# Patient Record
Sex: Male | Born: 1935
Health system: Southern US, Community
[De-identification: ages and names within clinical notes are randomized; demographics above are authoritative.]

## PROBLEM LIST (undated history)

## (undated) DIAGNOSIS — R7303 Prediabetes: Secondary | ICD-10-CM

## (undated) DIAGNOSIS — E785 Hyperlipidemia, unspecified: Secondary | ICD-10-CM

## (undated) DIAGNOSIS — I2510 Atherosclerotic heart disease of native coronary artery without angina pectoris: Secondary | ICD-10-CM

## (undated) DIAGNOSIS — I10 Essential (primary) hypertension: Secondary | ICD-10-CM

## (undated) HISTORY — DX: Atherosclerotic heart disease of native coronary artery without angina pectoris: I25.10

## (undated) HISTORY — PX: CORONARY ANGIOPLASTY WITH STENT PLACEMENT: SHX49

## (undated) HISTORY — DX: Prediabetes: R73.03

## (undated) HISTORY — DX: Essential (primary) hypertension: I10

## (undated) HISTORY — DX: Hyperlipidemia, unspecified: E78.5

## (undated) HISTORY — PX: TONSILLECTOMY: SUR1361

## (undated) HISTORY — PX: HERNIA REPAIR: SHX51

---

## 2002-05-15 ENCOUNTER — Inpatient Hospital Stay (HOSPITAL_COMMUNITY): Admission: EM | Admit: 2002-05-15 | Discharge: 2002-05-17 | Payer: Self-pay | Admitting: *Deleted

## 2003-03-15 ENCOUNTER — Encounter: Payer: Self-pay | Admitting: Cardiology

## 2003-03-15 ENCOUNTER — Inpatient Hospital Stay (HOSPITAL_COMMUNITY): Admission: EM | Admit: 2003-03-15 | Discharge: 2003-03-16 | Payer: Self-pay | Admitting: Emergency Medicine

## 2003-03-15 ENCOUNTER — Encounter: Payer: Self-pay | Admitting: Emergency Medicine

## 2004-03-17 ENCOUNTER — Encounter: Admission: RE | Admit: 2004-03-17 | Discharge: 2004-03-17 | Payer: Self-pay | Admitting: Family Medicine

## 2004-04-25 ENCOUNTER — Ambulatory Visit (HOSPITAL_COMMUNITY): Admission: RE | Admit: 2004-04-25 | Discharge: 2004-04-25 | Payer: Self-pay | Admitting: Gastroenterology

## 2004-11-20 ENCOUNTER — Ambulatory Visit: Payer: Self-pay | Admitting: Cardiology

## 2005-12-19 ENCOUNTER — Ambulatory Visit: Payer: Self-pay | Admitting: Cardiology

## 2006-12-13 ENCOUNTER — Ambulatory Visit: Payer: Self-pay | Admitting: Cardiology

## 2007-06-17 ENCOUNTER — Encounter: Payer: Self-pay | Admitting: Cardiology

## 2007-12-23 ENCOUNTER — Ambulatory Visit: Payer: Self-pay | Admitting: Cardiology

## 2009-01-14 ENCOUNTER — Ambulatory Visit: Payer: Self-pay | Admitting: Cardiology

## 2009-01-14 ENCOUNTER — Encounter: Payer: Self-pay | Admitting: Physician Assistant

## 2009-01-19 ENCOUNTER — Encounter: Payer: Self-pay | Admitting: Cardiology

## 2009-10-18 ENCOUNTER — Telehealth (INDEPENDENT_AMBULATORY_CARE_PROVIDER_SITE_OTHER): Payer: Self-pay | Admitting: *Deleted

## 2010-01-13 ENCOUNTER — Encounter: Payer: Self-pay | Admitting: Cardiology

## 2010-01-17 ENCOUNTER — Encounter: Payer: Self-pay | Admitting: Cardiology

## 2010-01-18 ENCOUNTER — Ambulatory Visit: Payer: Self-pay | Admitting: Cardiology

## 2010-01-26 ENCOUNTER — Encounter: Payer: Self-pay | Admitting: Cardiology

## 2010-02-01 ENCOUNTER — Encounter (INDEPENDENT_AMBULATORY_CARE_PROVIDER_SITE_OTHER): Payer: Self-pay | Admitting: *Deleted

## 2010-08-15 NOTE — Progress Notes (Signed)
Summary: NEED NEW RXS/NAME OF PCP CARDIOLOGIST PREFERS  Phone Note Call from Patient Call back at Home Phone [phone]   Caller: Spouse Call For: nurse Summary of Call: patient changing pharmacies and need new rxs sent  [facility]. Nurse informed her that rxs would be sent over now. Wife also would like to know names of PCP that Dr. Myrtis Ser would refer patient to since their PCP is no longer practicing in the area.     Initial call taken by: Carlye Grippe,  October 18, 2009 2:52 PM  Follow-up for Phone Call        If the patient lives in [place], please call our [place] office  to find out who they are recommending there. Let me know.   Additional Follow-up for Phone Call Additional follow up Details #1::        I recommend [employer] to call first. Also Brett Canales or [employer]. Also Syliva Overman.    Additional Follow-up for Phone Call Additional follow up Details #2::    Patient's wife informed of the above via message machine.  Follow-up by: Carlye Grippe,  October 28, 2009 11:06 AM  New/Updated Medications: ALTACE 2.5 MG TABS (RAMIPRIL) Take 1 tablet by mouth once a day Prescriptions: ALTACE 2.5 MG TABS (RAMIPRIL) Take 1 tablet by mouth once a day  #90 x 0   Entered by:   Carlye Grippe   Authorized by:   Talitha Givens, MD, [facility]   Signed by:   Carlye Grippe on 10/18/2009   Method used:   Electronically to        [facility] [phone]* (retail)       [address]       [place], [place]  [address]       Ph: [phone] or [phone]       Fax: [phone]   RxID:   [id number] ZETIA 10 MG TABS (EZETIMIBE) Take 1 tablet by mouth at bedtime  #90 x 0   Entered by:   Carlye Grippe   Authorized by:   Talitha Givens, MD, [facility]   Signed by:   Carlye Grippe on 10/18/2009   Method used:   Electronically to        [facility] [phone]* (retail)       [address]       [place], [place]  [address]    Ph: [phone] or [phone]       Fax: [phone]   RxID:   [id number] METOPROLOL SUCCINATE 100 MG XR24H-TAB (METOPROLOL SUCCINATE) Take 1 tablet by mouth once a day  #90 x 0   Entered by:   Carlye Grippe   Authorized by:   Talitha Givens, MD, [facility]   Signed by:   Carlye Grippe on 10/18/2009   Method used:   [facility]        [facility]  [address] [phone]* (retail)       [address]       [place], [place]  [address]       Ph: [phone] or [phone]       Fax: [phone]   RxID:   [id number] LIPITOR 80 MG TABS (ATORVASTATIN CALCIUM) Take 1 tablet by mouth once a day  #90 x 0   Entered by:   Carlye Grippe   Authorized  by:   Talitha Givens, MD, [facility]   Signed by:   Carlye Grippe on 10/18/2009   Method used:   Electronically to        [facility]. [phone]* (retail)       [address]       [place], [place]  [address]       Ph: [phone] or [phone]       Fax: [phone]   RxID:   [id number]

## 2010-08-15 NOTE — Miscellaneous (Signed)
  Clinical Lists Changes  Observations: Added new observation of PAST MED HX: HYPERTENSION HYPERLIPIDEMIA CAD   2003..DES  proximal LAD   /   cath 2004....cutting balloon...distal LAD Borderline diabetes mellitus. LV  normal DM   borderline Tobacco abuse (01/17/2010 18:36) Added new observation of PRIMARY MD: Illa Level, MD (01/17/2010 18:36)       Past History:  Past Medical History: HYPERTENSION HYPERLIPIDEMIA CAD   2003..DES  proximal LAD   /   cath 2004....cutting balloon...distal LAD Borderline diabetes mellitus. LV  normal DM   borderline Tobacco abuse

## 2010-08-15 NOTE — Assessment & Plan Note (Signed)
Summary: 6 MO FU PER JULY REMINDER-SRS   Visit Type:  Follow-up Primary Provider:  [employer]  CC:  CAD.  History of Present Illness: The patient is seen for cardiology followup.  He has known coronary disease.  He works every day with [employer] for [employer].  He has absolutely no symptoms.  He had a stent to the proximal LAD in 2003 and cutting balloon to the distal LAD in 2004.  He's not had any studies since then.  He prefers not to be evaluated unless he has symptoms.  There is normal LV function.  He continues to chew tobacco.  Preventive Screening-Counseling & Management  Alcohol-Tobacco     Smoking Status: quit     Year Quit: 1960     Cans of tobacco/week: chews 6-7pks/wk     Tobacco Counseling: to quit use of tobacco products  Current Medications (verified): 1)  Altace 2.5 Mg Tabs (Ramipril) .... Take 1 Tablet By Mouth Once A Day 2)  Lipitor 80 Mg Tabs (Atorvastatin Calcium) .... Take 1/2 Tablet By Mouth Once A Day 3)  Metoprolol Succinate 100 Mg Xr24h-Tab (Metoprolol Succinate) .... Take 1 Tablet By Mouth Once A Day 4)  Zetia 10 Mg Tabs (Ezetimibe) .... Take 1 Tablet By Mouth At Bedtime 5)  Nitroglycerin 0.4 Mg Subl (Nitroglycerin) .... One Tablet Under Tongue Every 5 Minutes As Needed For Chest Pain---May Repeat Times Three 6)  Aspir-Low 81 Mg Tbec (Aspirin) .... Take 1 Tablet By Mouth Once A Day  Allergies (verified): No Known Drug Allergies  Comments:  Nurse/Medical Assistant: The patient's medication list and allergies were reviewed with the patient and were updated in the Medication and Allergy Lists.  Past History:  Past Medical History: HYPERTENSION HYPERLIPIDEMIA CAD   2003..DES  proximal LAD   /   cath 2004....cutting balloon...distal LAD Borderline diabetes mellitus. LV  normal DM   borderline Tobacco abuse (chewing tobacco) Abdominal ultrasound.... July, 2010.... no evidence of abdominal aortic aneurysm  Social History: Cans of  tobacco/week:  chews 6-7pks/wk  Review of Systems       Patient denies fever, chills, headache, sweats, rash, change in vision, change in hearing, chest pain, cough, nausea vomiting, urinary symptoms.  All other systems are reviewed and are negative.  Vital Signs:  Patient profile:   77 year old male Height:      70 inches Weight:      165 pounds BMI:     23.76 Pulse rate:   73 / minute BP sitting:   122 / 78  (left arm) Cuff size:   regular  Vitals Entered By: Carlye Grippe (January 18, 2010 1:03 PM) CC: CAD   Physical Exam  General:  patient is stable. Head:  head is atraumatic. Eyes:  no xanthelasma. Neck:  no jugular venous tension. There is question of a soft right carotid bruit. Chest Wall:  no chest wall tenderness. Lungs:  lungs are clear.  Respiratory effort is nonlabored. Heart:  cardiac exam reveals S1-S2.  No clicks or significant murmurs. Abdomen:  abdomen is soft. Msk:  no musculoskeletal deformities. Extremities:  no peripheral edema. Skin:  no skin rashes. Psych:  patient is oriented to person time and place.  Affect is normal.   Impression & Recommendations:  Problem # 1:  * ??  CAROTID BRUIT There is question of a very soft carotid bruit.  We will obtain carotid Doppler.  I have no record of any carotid Doppler in the past.  Problem # 2:  HYPERTENSION, UNSPECIFIED (ICD-401.9)  His updated medication list for this problem includes:    Altace 2.5 Mg Tabs (Ramipril) .Marland Kitchen... Take 1 tablet by mouth once a day    Metoprolol Succinate 100 Mg Xr24h-tab (Metoprolol succinate) .Marland Kitchen... Take 1 tablet by mouth once a day    Aspir-low 81 Mg Tbec (Aspirin) .Marland Kitchen... Take 1 tablet by mouth once a day Blood pressure is under good control.  No change in therapy.  Problem # 3:  HYPERLIPIDEMIA-MIXED (ICD-272.4)  His updated medication list for this problem includes:    Lipitor 80 Mg Tabs (Atorvastatin calcium) .Marland Kitchen... Take 1/2 tablet by mouth once a day    Zetia 10 Mg Tabs  (Ezetimibe) .Marland Kitchen... Take 1 tablet by mouth at bedtime Patient is on meds for his lipids.  Labs are followed by his primary M.D.  Problem # 4:  CAD, NATIVE VESSEL (ICD-414.01)  His updated medication list for this problem includes:    Altace 2.5 Mg Tabs (Ramipril) .Marland Kitchen... Take 1 tablet by mouth once a day    Metoprolol Succinate 100 Mg Xr24h-tab (Metoprolol succinate) .Marland Kitchen... Take 1 tablet by mouth once a day    Nitroglycerin 0.4 Mg Subl (Nitroglycerin) ..... One tablet under tongue every 5 minutes as needed for chest pain---may repeat times three    Aspir-low 81 Mg Tbec (Aspirin) .Marland Kitchen... Take 1 tablet by mouth once a day  Orders: EKG w/ Interpretation (93000) Coronary disease is stable.  He is very active and has no symptoms.  EKG is done today and reviewed by me.  It is normal.  Patient is on appropriate medications.  No further workup and no change in therapy at this time.  We'll see him back in one year.  Other Orders: Carotid Duplex (Carotid Duplex)  Patient Instructions: 1)  Your physician wants you to follow-up in: 1 year. You will receive a reminder letter in the mail one-two months in advance. If you don't receive a letter, please call our office to schedule the follow-up appointment. 2)  Your physician recommends that you continue on your current medications as directed. Please refer to the Current Medication list given to you today. 3)  Your physician has requested that you have a carotid duplex. This test is an ultrasound of the carotid arteries in your neck. It looks at blood flow through these arteries that supply the brain with blood. Allow one hour for this exam. There are no restrictions or special instructions. If the results of your test are normal or stable, you will receive a letter. If they are abnormal, the nurse will contact you by phone.

## 2010-08-15 NOTE — Letter (Signed)
Summary: Engineer, materials at [facility]  [address]   [place], [place] [address]   Phone: [phone]  Fax: [phone]        February 01, 2010 MRN: [id number]    TAYSEAN WAGER [address] [place], [place]  [address]    Dear Mr. Ehle,  Your test ordered by Selena Batten has been reviewed by your physician (or physician assistant) and was found to be normal or stable. Your physician (or physician assistant) felt no changes were needed at this time.  ____ Echocardiogram  ____ Cardiac Stress Test  ____ Lab Work  __X__ Peripheral vascular study of neck-  ____ CT scan or X-ray  ____ Lung or Breathing test  ____ Other:   Thank you.   Cyril Loosen, RN, BSN    Duane Boston, M.D., F.A.C.C. Thressa Sheller, M.D., F.A.C.C. Oneal Grout, M.D., F.A.C.C. Cheree Ditto, M.D., F.A.C.C. Daiva Nakayama, M.D., F.A.C.C. Kenney Houseman, M.D., F.A.C.C. Jeanne Ivan, PA-C

## 2010-11-28 NOTE — Assessment & Plan Note (Signed)
[facility]                          [facility]   Randy Moyer, BISCHOF                         MRN:          [id number]  DATE:12/13/2006                            DOB:          1930-09-23    CARDIOLOGIST:  Dr. Willa Rough   PRIMARY CARE PHYSICIAN:  Dr. Henrine Screws of [facility] at  [facility].   HISTORY OF PRESENT ILLNESS:  Randy Moyer is a 77 year old male patient  followed by Dr. Myrtis Ser with a history of coronary artery disease, status  post stenting to the distal LAD in October 2003, who subsequently  underwent balloon angioplasty with a cutting balloon of distal LAD  secondary to in-stent restenosis in August 2004.  He returns today for  routine followup.  He notes he is doing well with any chest pain or  shortness of breath.  Denies any syncope, near-syncope, orthopnea,  paroxysmal nocturnal dyspnea, or edema.  He recently notes some left  shoulder pain for which he decreased his Lipitor to 40 mg daily.  His  shoulder discomfort stopped after this.  He has his cholesterol checked  with Dr. Abigail Miyamoto.  He has borderline diabetes and has been followed  through it closely recently by Dr. Abigail Miyamoto.   CURRENT MEDICATIONS:  1. Toprol XL 100 mg daily.  2. Aspirin 325 mg daily.  3. Zetia 10 mg q.h.s.  4. Lipitor 40 mg q.h.s.  5. Altace 2.5 mg daily.  6. Nitroglycerin p.r.n. chest pain.   ALLERGIES:  No known drug allergies.   PHYSICAL EXAMINATION:  GENERAL:  He is a well-nourished, well-developed  male in no acute distress.  VITAL SIGNS:  Blood pressure is 135/76.  Pulse 54.  Weight 182.6 pounds.  HEENT:  Normal.  NECK:  Without JVD, carotids without bruits bilaterally.  CARDIAC:  Normal S1, S2.  Regular rate and rhythm without murmurs.  LUNGS:  Clear to auscultation bilaterally without wheezing, rhonchi, or  rales.  ABDOMEN:  Soft, nontender with normoactive bowel sounds with no  organomegaly.  EXTREMITIES:   Without edema.  Calves soft, nontender.  SKIN:  Warm and dry.  NEUROLOGIC:  He is alert and oriented x3.  Cranial nerves 2-12 are  grossly intact.   Electrocardiogram reveals sinus rhythm with a heart rate of 55, normal  axis, no acute changes.   IMPRESSION:  1. Coronary artery disease.      a.     Status post stenting to the distal left anterior descending       October 2003.      b.     Status post cutting balloon angioplasty to the distal left       anterior descending secondary to in-stent restenosis August 2004.      c.     Residual coronary disease at catheterization August 2004:       Left anterior descending 20% proximal.  Second diagonal ostial       60%.  Right coronary artery 30% mid.  Primary preserved left       ventricular function.  2. Treated dyslipidemia.  3.  Borderline diabetes followed by Dr. Abigail Miyamoto.  4. Treated hypertension.   PLAN:  The patient was also interviewed and examined by Dr. Myrtis Ser today.  From a cardiovascular standpoint, he is doing well.  He recently  decreased his Lipitor secondary to shoulder pain.  This has resolved  with reduced Lipitor.  He follows this closely with Dr. Abigail Miyamoto.  Of  course, his goal LDL is less than 70 given his history of coronary  artery disease.  This will be rechecked again in September by Dr.  Abigail Miyamoto.  He can follow up with Dr. Myrtis Ser in 12 months' time or sooner  p.r.n.      Tereso Newcomer, PA-C  Electronically Signed      Luis Abed, MD, [facility]  Electronically Signed   SW/MedQ  DD: 12/13/2006  DT: 12/13/2006  Job #: [id number]   cc:   Chales Salmon. Abigail Miyamoto, M.D.

## 2010-11-28 NOTE — Assessment & Plan Note (Signed)
[facility]                          [facility]   CONSTANT, MANDEVILLE                         MRN:          [id number]  DATE:01/14/2009                            DOB:          1931-06-18    PRIMARY CARDIOLOGIST:  Luis Abed, MD, [facility]   REASON FOR VISIT:  One-year followup.   Mr. Randy Moyer continues to do extremely well from a cardiovascular  standpoint, since his visit here last June.  He remains quite active,  working 40 days a week for [employer] for [employer].  He denies any  development of exertional angina pectoris or significant dyspnea.   Unfortunately, Mr. Pember continues to chew tobacco.  He quit smoking  about 50 years ago.   EKG today indicates NSR at 61 bpm with normal axis; no ischemic changes.   REVIEW OF SYSTEMS:  Denies intermittent claudication, but complains of  leg cramps when lying in bed at night.  Remaining systems reviewed and  are negative.   MEDICATIONS:  1. Aspirin 325 daily.  2. Zetia 10 daily.  3. Lipitor 80 daily.  4. Altace 2.5 daily.  5. Toprol 100 daily.   PHYSICAL EXAMINATION:  VITAL SIGNS:  Blood pressure is 135/77, pulse is  64, regular, and weight is 169.  GENERAL:  A 77 year old male sitting upright in no distress.  HEENT:  Normocephalic, atraumatic.  NECK:  Palpable carotid pulse without bruits.  LUNGS:  Clear to auscultation in all fields.  HEART:  Regular rate and rhythm.  No significant murmurs.  No rubs.  ABDOMEN:  Soft, nontender.  Palpable epigastric mass, with soft bruit.  EXTREMITIES:  Palpable bilateral femoral pulse without bruits; palpable  posterior tibialis and dorsalis pedis pulses.  NEUROLOGIC:  No focal deficit.   IMPRESSION:  1. Severe single-vessel coronary artery disease.      a.     Status post cutting balloon PTCA of distal LAD, August       2004, secondary to the high-grade in-stent restenosis.      b.     Non STEMI/drug-eluting stent of high-grade proximal  LAD,       November 2003.  2. Normal LVF.  3. Dyslipidemia, well-controlled.      a.     LDL 59, December 2009.  4. Hypertension.  5. Borderline diabetes mellitus.  6. Ongoing tobacco.   PLAN:  1. Continue current medication regimen.  2. As previously noted, we will defer scheduling a screening stress      test given that he continues to remain quite active and      asymptomatic.  3. Surveillance abdominal ultrasound to rule out AAA, given his      history of tobacco smoking.  4. Decrease aspirin to 81 mg daily.  Renew prescription for p.r.n.      nitroglycerin.  5. Schedule return clinic followup with Dr. Myrtis Ser in 1 year.      Rozell Searing, PA-C  Electronically Signed      Luis Abed, MD, [facility]  Electronically Signed   GS/MedQ  DD: 01/14/2009  DT: 01/15/2009  Job #: [id number]   cc:   Chales Salmon. Abigail Miyamoto, M.D.

## 2010-11-28 NOTE — Assessment & Plan Note (Signed)
[facility]                          [facility]   LEALAND, ELTING                         MRN:          [id number]  DATE:12/23/2007                            DOB:          06-Feb-1931    Randy Moyer is doing very well.  He has coronary disease.  He is post  stenting to the distal LAD in 2003, and balloon angioplasty with cutting  balloon to the distal LAD secondary to in-stent stenosis in 2004.  Since  then he has had no symptoms.  He is quite active.  He does missionary  work in various places and has been fully active and has no problems.  He has no chest pain.  He has no shortness of breath.  There has been no  syncope or presyncope.   PAST MEDICAL HISTORY:   ALLERGIES:  No known drug allergies.   MEDICATIONS:  1. Toprol 100.  2. Aspirin 325.  3. Zetia 10.  4. Lipitor 80.  5. Altace 2.5.   His labs will be followed through Dr. Abigail Miyamoto, when the patient sees Dr.  Abigail Miyamoto over the next several months.   OTHER MEDICAL PROBLEMS:  See the list below.   REVIEW OF SYSTEMS:  He has no GI or GU symptoms.  He is not having any  headaches or fever.  He is fully active and has absolutely no  complaints.  He needs his medicines refilled, which we of course will  do.  His review of systems otherwise, is negative.   PHYSICAL EXAMINATION:  Today, his weight is 183 pounds.  This compares  to the same weight of last year.  Blood pressure is 140/72 with a pulse  of 61.  The patient is oriented to person, time, and place.  Affect is  normal.  HEENT:  No xanthelasma.  He has normal extraocular motion.  There are no carotid bruits.  There is no jugular venous distention.  LUNGS:  Clear.  Respiratory effort is not labored.  CARDIAC:  S1 with an S2.  There are no clicks or significant murmurs.  ABDOMEN:  Soft.  He has no masses or bruits.  There is no peripheral edema.  There are no musculoskeletal deformities.   EKG reveals normal sinus  rhythm.  There are no significant  abnormalities.   PROBLEMS:  1. Coronary artery disease with stenting to the distal LAD in October      2003, and cutting balloon to the same area in August 2004.  He is      quite stable.  In 2004, he had mild disease of a second diagonal      and minimal disease of his right coronary.  He had good LV      function.  The patient has not had exercise testing.  I have      discussed this with him.  He is fully active.  Even though it has      been many years, he has no symptoms and I am comfortable following      him, and he agrees.  2.  Hyperlipidemia.  This is being treated aggressively through Dr.      Abigail Miyamoto.  3. Borderline diabetes ?,  followed by Dr. Abigail Miyamoto.  4. Hypertension, treated.  5. Ongoing use of tobacco products.  The patient continues to chew      tobacco.  I discussed this with him again.  I reminded him that we      want him to stop completely.  If he cannot we urge him to cut his      usage down to the lowest level possible.  I had a full discussion      with him about this.   I am pleased with his overall status.  I will see him back in 1 year.     Randy Abed, MD, [facility]  Electronically Signed    JDK/MedQ  DD: 12/23/2007  DT: 12/24/2007  Job #: [id number]   cc:   Chales Salmon. Abigail Miyamoto, M.D.

## 2010-12-01 NOTE — H&P (Signed)
NAME:  Randy Moyer, Randy Moyer                            ACCOUNT NO.:  [id number]   MEDICAL RECORD NO.:  [id number]                   PATIENT TYPE:  INP   LOCATION:  3732                                 FACILITY:  [facility]   PHYSICIAN:  Creta Levin, M.D. [facility]      DATE OF BIRTH:  Feb 06, 1931   DATE OF ADMISSION:  03/15/2003  DATE OF DISCHARGE:                                HISTORY & PHYSICAL   CARDIOLOGIST:  Willa Rough, M.D.   PRIMARY CARE Kalena Mander:  Chales Salmon. Abigail Miyamoto, M.D.   CHIEF COMPLAINT:  Chest pain.   HISTORY OF PRESENT ILLNESS:  Randy Moyer is a 77 year old gentleman with  coronary artery disease who complains of 5/10 substernal chest pressure that  has been on and off for the past one to two days.  He states that the pain  is similar to the pain of his last heart attack, lasts for minutes at a  time, is worsened with exertion and with lying flat, and is partially  relieved with sublingual nitroglycerin.  He denies radiation of this pain,  shortness of breath, nausea, vomiting, diaphoresis, palpitations,  presyncope, or syncope.   PAST MEDICAL HISTORY:  1. Coronary artery disease.     a. Non ST elevation MI in October 2003.     b. Cardiac catheterization revealed the following:  There is no left        main.  LAD was 99% proximal.  Left circumflex was normal.  RCA was        normal.     c. PCI of the LAD with a 2.5 x 13 mm Cypher stent.  2. Hyperlipidemia.  3. Umbilical hernia.   ALLERGIES:  He has no known drug allergies.   MEDICATIONS:  1. Aspirin 81 mg daily.  2. Toprol XL 50 mg daily.  3. Zocor 80 mg q.h.s.  4. Zetia 10 mg daily.  5. Nitroglycerin p.r.n.   SOCIAL HISTORY:  He lives between [place] and [place] in [place] with  his wife.  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  He chews  tobacco and drinks approximately six or seven beers per day most days of the  week.   FAMILY HISTORY:  Coronary disease in his mother who died in her 22s.   His  father died of unknown causes.   REVIEW OF SYSTEMS:  Positive for chest pain, but otherwise negative for  complete 10 point review of systems.  He is a full code.   PHYSICAL EXAMINATION:  VITAL SIGNS:  Temperature 98.6, blood pressure  144/78, heart rate 78, respiratory rate normal, SAO2 98% on room air.  GENERAL:  He is in no acute distress.  HEENT:  He does have some abnormalities with his right eye which are  chronic.  He has moist mucous membranes.  NECK:  No jugular venous distention.  No carotid bruits.  No thyromegaly.  LUNGS:  Clear.  CARDIOVASCULAR:  Regular  rate and rhythm with no gallop or murmur.  There  was no rub.  SKIN:  He did have some actinic keratoses on the left chest wall.  ABDOMEN:  Soft, nondistended, nontender with normoactive bowel sounds.  RECTAL:  Heme-negative.  EXTREMITIES:  No clubbing, cyanosis, edema.  His femoral pulses were strong  and without bruits.  NEUROLOGIC:  Nonfocal.   LABORATORIES:  Chest x-ray:  There was some left subsegmental atelectasis  and ectatic aortic arch.  No edema.  No cardiomegaly.  His ECG revealed  normal sinus rhythm at a rate of 79 with no evidence of ischemia or infarct.  His laboratories thus far, hematocrit 43.  Sodium 140, potassium 3.9,  chloride 108, BUN 24, creatinine 1.1, glucose 83.  CK-MB 1.2, troponin I  less than 0.05.  The rest of his laboratories are pending.   ASSESSMENT/PLAN:  1. Chest pain.  These are similar symptoms to his last non ST elevation     myocardial infarction.  He was started on heparin in the emergency     department and we will continue this in addition to aspirin, metoprolol,     Zocor, and Zetia.  We will attempt to wean his nitroglycerin to     nitroglycerin paste.  We will add Altace to his regimen.  To further risk     stratify him we will need to perform a cardiac catheterization to assess     his LAD stent patency.  At this point I will not give him Plavix given     that there  is a possibility he may need surgical revascularization.  At     this point he is very stable.  If he has recurrent symptoms, EKG changes,     or positive bio markers, then I will add a 2B3A inhibitor.  Again, given     that his revascularization options are not identified, I will hold off on     giving him ReoPro.  If he does need the 2B3A inhibitor I will give him     Integrilin.  Apparently, he has pretty impressive hyperlipidemia and has     required Zetia on top of full dose Zocor.  I will check his a.m. lipid     panel and make adjustments accordingly.  2. ETOH use.  The patient does have a pretty significant drinking history.     He did not have problems with delirium tremens in the past and has never     had any events of such.  I will, however, have a low threshold for     starting him on Ativan for delirium tremens prophylaxis.                                                Creta Levin, M.D. [facility]    RPK/MEDQ  D:  03/15/2003  T:  03/15/2003  Job:  [id number]   cc:   Chales Salmon. Abigail Miyamoto, M.D.  [address]  [place]  [place] [address]  Fax: [phone]

## 2010-12-01 NOTE — Op Note (Signed)
NAME:  Randy Moyer, Randy Moyer NO.:  [id number]   MEDICAL RECORD NO.:  [id number]          PATIENT TYPE:  AMB   LOCATION:  ENDO                         FACILITY:  [facility]   PHYSICIAN:  Graylin Shiver, M.D.   DATE OF BIRTH:  12-25-1930   DATE OF PROCEDURE:  04/25/2004  DATE OF DISCHARGE:                                 OPERATIVE REPORT   PROCEDURE:  Colonoscopy.   INDICATIONS:  Screening.   Informed consent was obtained after explanation of the risks of bleeding,  infection, and perforation.   PREMEDICATION:  Fentanyl 50 mcg IV, Versed 7.5 mg IV.   PROCEDURE:  With the patient in the left lateral decubitus position, a  rectal exam was performed.  No masses were fel.  The Olympus colonoscope was  inserted into the rectum and advanced around the colon to the cecum.  Cecal  landmarks were identified.  The scope was brought back, visualizing the  colonic mucosa.  There were a few scattered diverticula noted around the  colon, including some seen on the right side of the colon.  There were no  other abnormalities noted from cecum to rectum.  He tolerated the procedure  well without complications.   IMPRESSION:  Scattered diverticula in the colon.   I would recommend a follow-up screening colonoscopy again in 10 years.       SFG/MEDQ  D:  04/25/2004  T:  04/25/2004  Job:  [id number]   cc:   Chales Salmon. Abigail Miyamoto, M.D.  [address]  [place]  [place] [address]  Fax: [phone]

## 2010-12-01 NOTE — Cardiovascular Report (Signed)
NAME:  Randy Moyer, Randy Moyer                            ACCOUNT NO.:  [id number]   MEDICAL RECORD NO.:  [id number]                   PATIENT TYPE:  INP   LOCATION:  6529                                 FACILITY:  [facility]   PHYSICIAN:  Veneda Melter, M.D.                   DATE OF BIRTH:  08-20-1930   DATE OF PROCEDURE:  03/15/2003  DATE OF DISCHARGE:                              CARDIAC CATHETERIZATION   PROCEDURES PERFORMED:  1. Left heart catheterization.  2. Left ventriculogram.  3. Selective coronary angiography.  4. PTCA of the distal left anterior descending.   DIAGNOSES:  1. Single vessel coronary artery disease.  2. In-stent restenosis.  3. Normal left ventricular systolic function.   HISTORY:  Randy Moyer is a 77 year old white male with a history of coronary  disease who has previously undergone percutaneous intervention with stent  placement to the distal LAD on May 15, 2002.  He presents now with  crescendo and unstable angina prompting admission to the hospital.  He is  ruled out for acute myocardial infarction.  Presents for further assessment.   TECHNIQUE:  Informed consent was obtained.  The patient was brought to the  catheterization laboratory.  A 6-French sheath placed in the right femoral  artery using modified Seldinger technique.  A 6-French JL4 and JR4 catheter  was then used to engage the left and right coronary arteries.  Selective  angiography performed in various projections using manual injections of  contrast.  A 6-French pigtail catheter was advanced left ventricle and left  ventriculogram performed using power injections of contrast.  A 6-French  Q3.5 guide catheter was inserted and used to perform further angiography  LAD.   FINDINGS:  1. Left main trunk:  Short.  Angiographically normal.  2. LAD:  This begins as a medium caliber vessel and provides two diagonal     branches and then extends to the apex.  The LAD has mild disease of 20%     in the  proximal mid section.  There is a previously placed stent in the     distal section immediately after the second diagonal branch with severe     narrowing of 99% in the proximal segment of this stent.  There is TIMI 2     flow in the distal LAD and this vessel has diffuse disease of 30%.  The     first diagonal branch is a small caliber vessel with mild irregularities.     Second diagonal branch is a large vessel that bifurcates in the mid     section.  There is ostial disease of 60%.  3. Left circumflex artery:  This is a medium caliber vessel and consists of     a large marginal branch mid section.  Left circumflex has mild     irregularities.  4. Right coronary artery is dominant.  This is  a large caliber vessel that     provides a posterior descending artery and a two posterior ventricular     branches in the terminal segment.  The right coronary has mild disease of     30% in the mid section.  5. LV:  Normal end-systolic and end-diastolic dimensions.  Overall left     ventricular function is well preserved.  Ejection fraction greater than     55%.  No mitral regurgitation.  LV pressure is 100/10.  Aortic is 100/55.     LVEDP equals 15.   With these findings, we elected to proceed with percutaneous intervention to  the distal LAD.  The patient was enrolled in the ACUITY study and randomized  to Angiomax and Integrilin in the laboratory.  He had previously been on  aspirin.  A 0.014 inch Asahi soft wire was advanced into the diagonal branch  for protection and a Cross-It 200 XT introduced and used to engage the  distal LAD.  After several attempts this was advanced into the distal LAD.  A 2.5 x 10 mm Maverick balloon was introduced and two inflations performed  in the distal LAD at 6 atmospheres for 30 seconds with repeat angiography  showing significant improvement in vessel lumen and distal flow.  A 2.75 x  10 mm cutting balloon was then introduced and total of four inflations   performed in the distal LAD straddling the diagonal branch at 6 atmospheres  for 30 seconds.  Repeat angiography was then performed after the  administration of intracoronary nitroglycerin showing excellent result with  only mild residual narrowing of 30%.  There did appear to be some worsening  of the narrowing at the ostium of the second diagonal branch to perhaps 70%.  However, there was TIMI 3 flow in the LAD and diagonal branch and no vessel  damage.  This was deemed acceptable result.  The guide catheter was removed  and the sheath secured into position.  The patient tolerated procedure well  and was transferred to floor in stable condition.   FINAL RESULTS:  Successful PTCA of the distal left anterior descending with  reduction of 99% in-stent restenosis to less than 30% using a 2.75 mm  cutting balloon.   ASSESSMENT/PLAN:  Randy Moyer is a 77 year old gentleman with advanced single  vessel coronary artery disease.  He has unfortunately suffered restenosis of  a complex bifurcation lesion.  Should he have recurrent symptoms and  restenosis, additional stent placement in the LAD and diagonal branch may be  necessary.                                                Veneda Melter, M.D.    NG/MEDQ  D:  03/15/2003  T:  03/16/2003  Job:  [id number]   cc:   Chales Salmon. Abigail Miyamoto, M.D.  [address]  [place]  [place] [address]  Fax: [phone]   Willa Rough, M.D.

## 2010-12-01 NOTE — Cardiovascular Report (Signed)
NAME:  Randy Moyer, Randy Moyer                            ACCOUNT NO.:  [id number]   MEDICAL RECORD NO.:  [id number]                   PATIENT TYPE:  INP   LOCATION:  2925                                 FACILITY:  [facility]   PHYSICIAN:  Charlies Constable, M.D. [facility]              DATE OF BIRTH:  12/21/1930   DATE OF PROCEDURE:  05/15/2002  DATE OF DISCHARGE:                              CARDIAC CATHETERIZATION   CLINICAL HISTORY:  The patient is 77 years old and has no prior history of  known heart disease. He presented to [facility] Emergency Room with severe  chest pain and was transferred here and had positive enzymes. He was  enrolled in the Synergy trial and was randomized to unfractionated heparin  and was also treated with Integrilin.   PROCEDURES PERFORMED:  1. Left heart catheterization.  2. Coronary angiography.  3. Left ventriculography and stent placement.   DESCRIPTION OF PROCEDURE:  The procedure was performed via the right femoral  artery using an arterial sheath and 6 French preformed coronary catheters.  A front wall arterial puncture was performed and Omnipaque contrast was  used.  There was separate ostia of the LAD and circumflex.  We had  difficulty selectively engaging the LAD and finally accomplished this with a  diagnostic JR 3.5 guiding catheter.  After completion of the diagnostic  study, we made a decision to proceed with intervention on the LAD lesion.   The patient was given additional heparin to prolong the ACT to greater than  200 seconds.  He was given 300 mg of Plavix at the end of the procedure.  We  used a Q 3.5, 6 [place] guiding catheter with side holes and with some  difficulty we were able to selectively engage the LAD.  We were able to  navigate a luge wire across the lesion in the proximal LAD without too much  difficulty.  We direct stented with a 2.5 x 13 mm Cypher stent deploying  this at initial inflation of 11 atmospheres for 30 seconds and a second  inflation of 15 atmospheres for 30 seconds with the balloon just inside the  distal edge.  Repeat diagnostic studies were then performed through the  guiding catheter.  The patient tolerated the procedure well and left the  laboratory in satisfactory condition.   RESULTS:  There was no left main coronary artery.   Left anterior descending:  The left anterior descending artery gave rise to  two diagonal branches and a septal perforator and then had a 99% stenosis  with TIMI-2 flow distally.   Circumflex artery:  The circumflex artery which arose from a separate ostium  gave rise to a small marginal branch, a large marginal branch and an AV  branch which terminated in a small posterolateral branch.  These vessels  were free of significant disease.   Right coronary artery:  The right coronary is a  dominant vessel that gave  rise to two right ventricular branchers, a posterior descending branch and  three posterolateral branches.  These vessels were free of significant  disease.   LEFT VENTRICULOGRAPHY:  The left ventriculogram performed in the RAO  projection showed akinesis of the tip of the apex.  The overall wall motion  was good with an estimated ejection fraction of 60%.   Following stenting of the lesion in the proximal LAD, the stenosis improved  from 90% to less than 10% and the flow improved from TIMI-2 to TIMI-3 flow.   CONCLUSION:  1. Non-ST segment elevation myocardial infarction with 99% stenosis in the     proximal left anterior descending with TIMI-2 flow, no major obstruction     in the circumflex and right coronary arteries, and a small area of apical     akinesis.  2. Successful deployment of a Cypher stent in the proximal left anterior     descending with improvement in percent diameter narrowing from 99% to     less than 10% and improvement in flow from TIMI-2 to TIMI-3 flow.   DISPOSITION:  The patient was returned to the postangioplasty unit for  further  observation.   The left ventricular pressure was 109/14 and the aortic pressure was 109/59  with a mean of 87.                                                   Charlies Constable, M.D. [facility]    BB/MEDQ  D:  05/15/2002  T:  05/16/2002  Job:  [id number]   cc:   Chales Salmon. Abigail Miyamoto, M.D.   Willa Rough, M.D. [facility]   [facility]   Cardiopulmonary Laboratory

## 2010-12-01 NOTE — Discharge Summary (Signed)
   NAME:  Randy Moyer, Randy Moyer                            ACCOUNT NO.:  [id number]   MEDICAL RECORD NO.:  [id number]                   PATIENT TYPE:  INP   LOCATION:  3715                                 FACILITY:  [facility]   PHYSICIAN:  Dian Queen, P.A. [facility]               DATE OF BIRTH:  04/30/1931   DATE OF ADMISSION:  05/15/2002  DATE OF DISCHARGE:  05/17/2002                           DISCHARGE SUMMARY - REFERRING   HISTORY OF PRESENT ILLNESS:  The patient is a 77 year old white married male  with no prior cardiac history, who had intermittent substernal chest  discomfort described as indigestion that waxed and waned for a period of  several days, and was worse at night, when he tried to lie down.  This  finally worsened to the point where he went to the [facility]  Emergency Room where he had some acute electrocardiogram changes.  He was  given morphine.  He ruled in for an infarction, and was then begun on  aspirin, Lopressor, nitroglycerin, heparin, Integrilin, and transferred to  [facility] for further evaluation.   MEDICATIONS:  None regularly prior to admission.   ACCESSORY CLINICAL FINDINGS:  TSH 3.485 (normal), hemoglobin A1c of 5.8.  Electrolytes and renal function totally normal.  CK-MB peaked at 637/62.8,  troponin peaked at 7.3.  Hemoglobin 12.8, hematocrit 37.9, white count 8500.  A chest x-ray was unremarkable.   HOSPITAL COURSE:  The patient was transferred from [facility] after  ruling in.  He was taken urgently to the cardiac catheterization laboratory  by Dr. Charlies Constable and found to have a 99% proximal LAD after the takeoff  of the first diagonal.  This was dilated and stented without complications,  a little akinetic area at the apex.  The remainder of his coronaries were  totally normal.  His ejection fraction was 60%.   DISCHARGE DIAGNOSES:  1. Acute coronary syndrome with non-Q myocardial infarction, treated with     urgent left  anterior descending coronary artery stent, without     complications.  2. Preserved left ventricular function.  3. Significant ETOH use.   DISPOSITION:  The patient is discharged home.   DISCHARGE MEDICATIONS:  1. Toprol XL 50 mg q.d.  2. Aspirin one q.d.  3.     Plavix 75 mg q.d.  4. Zocor 20 q.h.s.  5. Nitroglycerin p.r.n.                                               Dian Queen, P.A. [facility]    BY/MEDQ  D:  05/17/2002  T:  05/18/2002  Job:  [id number]   cc:   Chales Salmon. Abigail Miyamoto, M.D.   [facility]  [place], [place]

## 2010-12-01 NOTE — H&P (Signed)
NAME:  Randy Moyer, Randy Moyer NO.:  [id number]   MEDICAL RECORD NO.:  [id number]                   PATIENT TYPE:  EMS   LOCATION:  MAJO                                 FACILITY:  [facility]   PHYSICIAN:  Willa Rough, MD [facility]                DATE OF BIRTH:  1931-05-23   DATE OF ADMISSION:  05/15/2002  DATE OF DISCHARGE:                                HISTORY & PHYSICAL   HISTORY OF PRESENT ILLNESS:  The patient is a pleasant 77 year old gentleman  who is retired from [employer] and lives between [place] and [place].  He developed chest discomfort on the morning of May 14, 2002.  This  resolved and he was able to work throughout the day.  At 7:30 in the evening  on May 14, 2002, he developed more severe chest pain; this persisted.  There was no nausea or vomiting but there was a sense of indigestion.  The  patient went to [facility] ER.  There, the EKG revealed no acute change.  He  received morphine for his pain; he also received aspirin, Lopressor and  nitroglycerin paste.  His enzymes came back positive with his troponin at  2.3.  I was contacted and heparin and Integrilin were added and the patient  was transported to [facility].  He was pain-free before he left the emergency room  at [facility] and he has not had return of pain since he has been here.  The  patient does have a family history of heart disease and he does chew  tobacco.   MEDICATIONS:  None.   ALLERGIES:  None.   PAST MEDICAL HISTORY:  Other medical problems:  See the complete medical  problem list at the end of this note.   SOCIAL HISTORY:  The patient lives with his wife.  He is retired from  [employer].  He does chew tobacco.  He also drinks 7 to 10 beers many  days; it is not clear if it is most days or not.  I do not believe there are  any behavioral difficulties related to his alcohol intake.   FAMILY HISTORY:  The patient's mother had heart disease and died in her 49s.  Father died when the patient was quite young, etiology unknown.   REVIEW OF SYSTEMS:  The patient has not had any significant rashes.  He has  had no significant fever.  He has no significant HEENT problems.  He does  wear dentures.  He does notice a cough that occurs mostly at night; this has  begun only the last few weeks.  There is a mild sputum production that he  describes as whitish or greenish.  He has had no fever with this.  There  has been no obvious blood in the sputum.  The patient had the chest pain as  described today.  He eats without difficulties with no  GI symptoms.  He has  normal bowel movements.  The patient has no urinary complaints.  He has no  major musculoskeletal problem or neurologic complaints.  The remainder of  his review of systems is negative.   PHYSICAL EXAMINATION:  VITAL SIGNS:  Temperature is 97.8, blood pressure  118/70 with a rate of 70 and respirations 16.  GENERAL:  The patient is in no acute distress at this time.  SKIN:  There are no obvious significant skin rashes.  HEENT:  HEENT reveals no obvious significant problems.  LUNGS:  Lungs reveal a few scattered rhonchi.  NECK:  Neck reveals no bruits.  CARDIAC:  Exam reveals an S1 with an S2 but no clicks or significant  murmurs.  ABDOMEN:  Abdomen is soft.  There are normal bowel sounds.  There are no  obvious masses felt.  EXTREMITIES:  Extremities reveal no significant edema.   LABORATORY AND ACCESSORY CLINICAL DATA:  The labs that came to [place] from  [facility] include a hemoglobin of 15 with a platelet count of 242,000.  Potassium is 3.9 and renal function is good with a BUN of 11 and a  creatinine of 1.1.  INR is 0.9.  One CPK is available for [place] with a CPK of  329, MB 36, troponin 2.3.   EKG reveals a Q wave in lead III.  The second EKG suggests the possibility  of some T wave changes in the anterior leads but this is a borderline call  and followup EKGs will be necessary.   Portable chest  x-ray reveals no marked abnormalities by my review; this will  need to be over-read.  I have ordered a complete PA and lateral chest film.   PROBLEM LIST:  1. Status post fractured left leg, healed.  2. Nicotine intake by chewing tobacco.  3. Alcohol intake with 7 to 10 beers per day on many/? most days.  4. History of umbilical hernia repair in the past.  5. Acute non-ST-elevation myocardial infarction.  We have only one CPK so     far.  The patient has definitely had an infarct.  Electrocardiogram is     not diagnostic at this time and pain has resolved.  I attempted a     portable bedside 2-D echocardiogram.  The images were extremely limited     and I could not assess left ventricular function well.  6. Cough over the past few weeks with some sputum production.   PLAN:  The patient is receiving aspirin, beta blocker, IV nitroglycerin, IV  heparin and a IIb/IIIa inhibitor -- Integrilin.  I have chosen not to start  Plavix at this time.  We will plan cardiac catheterization; the timing will  be dependent on whether the patient has return of symptoms and the overall  cath lab schedule.  I have started folate and thiamine.  I doubt that the  patient will have DTs from his alcohol, but this needs to be kept in mind.  Also, we need to plan careful followup of his chest x-ray and the issue of  his cough.  We will try to assess sputum if he has any.                                                  Willa Rough, MD [facility]    JK/MEDQ  D:  05/15/2002  T:  05/15/2002  Job:  [id number]   cc:   Chales Salmon. Abigail Miyamoto, M.D.

## 2010-12-01 NOTE — Discharge Summary (Signed)
NAME:  Randy Moyer, Randy Moyer                            ACCOUNT NO.:  [id number]   MEDICAL RECORD NO.:  [id number]                   PATIENT TYPE:  INP   LOCATION:  6529                                 FACILITY:  [facility]   PHYSICIAN:  Arturo Morton. Riley Kill, M.D.             DATE OF BIRTH:  09/15/1930   DATE OF ADMISSION:  03/15/2003  DATE OF DISCHARGE:  03/16/2003                                 DISCHARGE SUMMARY   DISCHARGE DIAGNOSES:  1. Coronary artery disease.     A. Status post PTCA of the distal LAD secondary to in-stent restenosis on        March 15, 2003.     B. Status post non-ST elevation myocardial infarction October 2003        treated with PCI to the LAD.  2. Hypertension.  3. Acuity trial.  4. Tobacco abuse.  5. Dyslipidemia, treated.   PROCEDURES PERFORMED THIS ADMISSION:  Cardiac catheterization, percutaneous  coronary intervention by Dr. Veneda Melter on March 15, 2003.   HOSPITAL COURSE:  Randy Moyer is a 77 year old male patient of Dr. Myrtis Ser who is  followed for a history of coronary artery disease status post stenting to  the LAD in October 2003 for non-ST elevation myocardial infarction. He  presented to the emergency room on March 15, 2003 with complaints of  substernal chest pain. It was similar pain to his previous heart attack. His  electrocardiogram showed no acute changes. His chest x-ray showed hepatic  aortic arch, no edema. His cardiac enzymes remained negative. It was felt he  needed further evaluation with cardiac catheterization. He was taken to the  catheterization lab by Dr. Chales Abrahams on March 15, 2003. Angiographic data  showed a short left main, LAD with 99% distal end-stent restenosis, 60%  ostial D2, left circumflex with mild disease, and RCA with a PDA and two PD  branches and 30% mid disease. Left ventriculogram revealed an EF of greater  than 55%, no MR. He underwent PTCA of the mid LAD restenosis, reducing the  lesion to 30% and restoring flow from  TIMI-II to TIMI-III. On the morning of  March 16, 2003, he is stable condition. Dr. Myrtis Ser saw the patient and felt  he was ready for discharge home. He was enrolled in the Acuity trial. His  Zocor was changed to Lipitor. The research staff would be in touch with him  for followup.   LABORATORY DATA:  At discharge, white count 6,700, hemoglobin 12.6,  hematocrit 37.1, MCV 90.1, platelet count 184,000. INR 0.9, sodium 140,  potassium 3.9, chloride 108, BUN 24, creatinine 1.1, magnesium 2.3.  Hemoglobin A1c 6. Cardiac enzymes negative x3. Total cholesterol 119,  triglycerides 102, HDL 41, LDL 58. TSH 2.826. Chest x-ray:  Stable scarring  of the left base.   DISCHARGE MEDICATIONS:  1. Toprol-XL 100 mg daily (this was increased from 50 mg daily this  admission).  2. Coated aspirin 325 mg daily.  3. Zetia 10 mg q.h.s.  4. Altace 2.5 mg daily (this medication is new).  5. Lipitor 80 mg q.h.s. (this medication is new).  6. Nitroglycerin p.r.n. chest pain.   PAIN MANAGEMENT:  Tylenol as needed. Nitroglycerin as needed for chest pain.   The patient is to call our office in [place] or 911 with recurrent chest pain.   ACTIVITY:  No driving, lifting, exertional work, or sex for three days.   DIET:  Low fat, low sodium.   WOUND CARE:  The patient is to call our office in [place] with any groin  swelling, bleeding, or bruising.   SPECIAL INSTRUCTIONS:  The patient has been advised to stop smoking. He was  visited by smoking cessation consultation this admission. The research  office at [facility] in [place] will contact the patient for a  followup appointment regarding the Acuity trial.   FOLLOW UP:  Followup is with Dr. Myrtis Ser in [place] on Friday, September 10 at  2:15 p.m. He will have a BMET drawn that day given the initiation of Altace  this admission.      Tereso Newcomer, P.A.                        Arturo Morton. Riley Kill, M.D.    SW/MEDQ  D:  03/16/2003  T:  03/16/2003  Job:   [id number]   cc:   Chales Salmon. Abigail Miyamoto, M.D.  [address]  [place]  [place] [address]  Fax: [phone]

## 2010-12-27 ENCOUNTER — Encounter: Payer: Self-pay | Admitting: Cardiology

## 2011-01-22 ENCOUNTER — Encounter: Payer: Self-pay | Admitting: Cardiology

## 2011-01-22 DIAGNOSIS — R9430 Abnormal result of cardiovascular function study, unspecified: Secondary | ICD-10-CM | POA: Insufficient documentation

## 2011-01-22 DIAGNOSIS — I739 Peripheral vascular disease, unspecified: Secondary | ICD-10-CM

## 2011-01-22 DIAGNOSIS — E785 Hyperlipidemia, unspecified: Secondary | ICD-10-CM | POA: Insufficient documentation

## 2011-01-22 DIAGNOSIS — E119 Type 2 diabetes mellitus without complications: Secondary | ICD-10-CM | POA: Insufficient documentation

## 2011-01-22 DIAGNOSIS — I779 Disorder of arteries and arterioles, unspecified: Secondary | ICD-10-CM | POA: Insufficient documentation

## 2011-01-22 DIAGNOSIS — Z720 Tobacco use: Secondary | ICD-10-CM | POA: Insufficient documentation

## 2011-01-22 DIAGNOSIS — I10 Essential (primary) hypertension: Secondary | ICD-10-CM | POA: Insufficient documentation

## 2011-01-22 DIAGNOSIS — I2510 Atherosclerotic heart disease of native coronary artery without angina pectoris: Secondary | ICD-10-CM | POA: Insufficient documentation

## 2011-01-23 ENCOUNTER — Ambulatory Visit (INDEPENDENT_AMBULATORY_CARE_PROVIDER_SITE_OTHER): Payer: Medicare Other | Admitting: Cardiology

## 2011-01-23 ENCOUNTER — Encounter: Payer: Self-pay | Admitting: Cardiology

## 2011-01-23 DIAGNOSIS — I779 Disorder of arteries and arterioles, unspecified: Secondary | ICD-10-CM

## 2011-01-23 DIAGNOSIS — Z720 Tobacco use: Secondary | ICD-10-CM

## 2011-01-23 DIAGNOSIS — E785 Hyperlipidemia, unspecified: Secondary | ICD-10-CM

## 2011-01-23 DIAGNOSIS — F17200 Nicotine dependence, unspecified, uncomplicated: Secondary | ICD-10-CM

## 2011-01-23 DIAGNOSIS — I2510 Atherosclerotic heart disease of native coronary artery without angina pectoris: Secondary | ICD-10-CM

## 2011-01-23 MED ORDER — NITROGLYCERIN 0.4 MG SL SUBL: 0.4000 mg | SUBLINGUAL_TABLET | SUBLINGUAL | Status: DC | PRN

## 2011-01-23 NOTE — Assessment & Plan Note (Signed)
He is on appropriate medications and is followed by his primary care team.  I'll see him back in one year for followup.

## 2011-01-23 NOTE — Assessment & Plan Note (Signed)
He has no significant symptoms.  His EKG is unchanged.  No further workup.

## 2011-01-23 NOTE — Assessment & Plan Note (Signed)
He continues to chew tobacco.  I counseled him to stop.

## 2011-01-23 NOTE — Assessment & Plan Note (Signed)
The patient did have a carotid Doppler since I saw him last year.  He has mild disease.  He does not need any followup this year.

## 2011-01-23 NOTE — Progress Notes (Signed)
HPI Patient is seen for followup coronary artery disease.  He had an intervention in 2004.  He is fully active.  He does not have chest pain or shortness of breath.  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  Historically he does not like to have tests unless he has symptoms.  He has not had any type of exercise testing since 2004.  He does continue to use smokeless tobacco and I have counseled him to stop. No Known Allergies  Current Outpatient Prescriptions  Medication Sig Dispense Refill  . aspirin (ASPIR-LOW) 81 MG EC tablet Take 81 mg by mouth daily.        Marland Kitchen atorvastatin (LIPITOR) 80 MG tablet Take 80 mg by mouth daily.        . metoprolol (TOPROL-XL) 100 MG 24 hr tablet Take 100 mg by mouth daily.        . nitroGLYCERIN (NITROSTAT) 0.4 MG SL tablet Place 0.4 mg under the tongue every 5 (five) minutes as needed.        . ramipril (ALTACE) 2.5 MG capsule Take 2.5 mg by mouth daily.        Marland Kitchen DISCONTD: ezetimibe (ZETIA) 10 MG tablet Take 10 mg by mouth daily.          History   Social History  . Marital Status: Married    Spouse Name: N/A    Number of Children: N/A  . Years of Education: N/A   Occupational History  . Not on file.   Social History Main Topics  . Smoking status: Former Smoker -- 0.5 packs/day for 6 years    Types: Cigarettes    Quit date: 07/16/1958  . Smokeless tobacco: Current User    Types: Chew   Comment: chews 1 pack tobacco per day  . Alcohol Use: Not on file  . Drug Use: Not on file  . Sexually Active: Not on file   Other Topics Concern  . Not on file   Social History Narrative   Regularly exercise.     Family History  Problem Relation Age of Onset  . Coronary artery disease Other     Past Medical History  Diagnosis Date  . Hypertension   . Hyperlipidemia   . CAD (coronary artery disease)     cath 2004...cutting ballooon...distal LAD  . Diabetes mellitus     borderline  . Tobacco abuse     Chewing tobacco  . Abdominal ultrasound,  abnormal     2010, no evidence of abdominal aortic aneurysm  . Ejection fraction     Normal by history  . Carotid artery disease     Doppler, July, 2011, scattered calcific plaque particularly at the bulb s, no significant carotid artery stenosis    No past surgical history on file.  ROS  Patient denies fever, chills, headache, sweats, rash, change in vision, change in hearing, chest pain, cough, nausea vomiting, urinary symptoms.  All other systems are reviewed and are negative.  PHYSICAL EXAM Patient looks quite good.  Head is atraumatic.  There is no jugular venous distention.  Lungs are clear.  Respiratory effort is nonlabored.  Cardiac exam reveals S1-S2.  No clicks or significant murmurs.  The abdomen is soft.  There is no peripheral edema. Filed Vitals:   01/23/11 0904  BP: 122/72  Pulse: 55  Height: 5\' 10"  (1.778 m)  Weight: 165 lb (74.844 kg)    EKG Is done and reviewed by me.  It is normal.  ASSESSMENT &  PLAN

## 2011-01-23 NOTE — Patient Instructions (Signed)
Continue all current medications. Your physician wants you to follow up in:  1 year.  You will receive a reminder letter in the mail one-two months in advance.  If you don't receive a letter, please call our office to schedule the follow up appointment   

## 2011-01-31 ENCOUNTER — Other Ambulatory Visit: Payer: Self-pay | Admitting: Cardiology

## 2011-02-03 ENCOUNTER — Other Ambulatory Visit: Payer: Self-pay | Admitting: Cardiology

## 2011-09-06 DIAGNOSIS — C61 Malignant neoplasm of prostate: Secondary | ICD-10-CM | POA: Diagnosis not present

## 2011-09-13 DIAGNOSIS — C61 Malignant neoplasm of prostate: Secondary | ICD-10-CM | POA: Diagnosis not present

## 2011-12-19 ENCOUNTER — Other Ambulatory Visit: Payer: Self-pay

## 2011-12-19 DIAGNOSIS — E7800 Pure hypercholesterolemia, unspecified: Secondary | ICD-10-CM | POA: Diagnosis not present

## 2011-12-19 DIAGNOSIS — Z79899 Other long term (current) drug therapy: Secondary | ICD-10-CM | POA: Diagnosis not present

## 2011-12-19 DIAGNOSIS — I10 Essential (primary) hypertension: Secondary | ICD-10-CM | POA: Diagnosis not present

## 2011-12-19 DIAGNOSIS — Z0000 Encounter for general adult medical examination without abnormal findings: Secondary | ICD-10-CM | POA: Diagnosis not present

## 2012-01-25 ENCOUNTER — Ambulatory Visit (INDEPENDENT_AMBULATORY_CARE_PROVIDER_SITE_OTHER): Payer: Medicare Other | Admitting: Cardiology

## 2012-01-25 ENCOUNTER — Encounter: Payer: Self-pay | Admitting: Cardiology

## 2012-01-25 VITALS — BP 119/63 | HR 55 | Ht 70.5 in | Wt 161.8 lb

## 2012-01-25 DIAGNOSIS — I2510 Atherosclerotic heart disease of native coronary artery without angina pectoris: Secondary | ICD-10-CM

## 2012-01-25 DIAGNOSIS — E785 Hyperlipidemia, unspecified: Secondary | ICD-10-CM

## 2012-01-25 DIAGNOSIS — I10 Essential (primary) hypertension: Secondary | ICD-10-CM

## 2012-01-25 NOTE — Assessment & Plan Note (Signed)
Coronary disease is stable. The patient did originally have symptoms when he had his event in 2004. He has had no return of symptoms. He is very active without symptoms. He would prefer to have no type of studies unless he has symptoms.

## 2012-01-25 NOTE — Assessment & Plan Note (Signed)
Lipids are being treated well with a good response. No change in therapy.

## 2012-01-25 NOTE — Patient Instructions (Addendum)
   Your physician recommends that you schedule a follow-up appointment in: 1 year with Dr. Myrtis Ser. You will receive a reminder letter in the mail in about 10months reminding you to call and schedule your appointment. If you don't receive this letter, please contact our office.    Your physician recommends that you continue on your current medications as directed. Please refer to the Current Medication list given to you today.

## 2012-01-25 NOTE — Assessment & Plan Note (Signed)
Blood pressures control. No change in therapy. 

## 2012-01-25 NOTE — Progress Notes (Signed)
HPI   The patient is doing extremely well. I saw him last one year ago. He had a coronary intervention in 2004. He did have chest discomfort and chest burning at that time. He has had no return of symptoms. 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 He has no symptoms. Historically he has chosen to not have any followup studies unless he has significant symptoms. He is taking appropriate medications. Recent labs show that his LDL is 70. His renal function is good.  No Known Allergies  Current Outpatient Prescriptions  Medication Sig Dispense Refill  . aspirin (ASPIR-LOW) 81 MG EC tablet Take 81 mg by mouth daily.        Marland Kitchen atorvastatin (LIPITOR) 80 MG tablet Take 80 mg by mouth daily.        . metoprolol (TOPROL-XL) 100 MG 24 hr tablet TAKE 1 TABLET BY MOUTH ONCE A DAY  90 tablet  2  . nitroGLYCERIN (NITROSTAT) 0.4 MG SL tablet Place 1 tablet (0.4 mg total) under the tongue every 5 (five) minutes as needed.  25 tablet  3  . ramipril (ALTACE) 2.5 MG capsule Take 2.5 mg by mouth daily.          History   Social History  . Marital Status: Married    Spouse Name: N/A    Number of Children: N/A  . Years of Education: N/A   Occupational History  . Not on file.   Social History Main Topics  . Smoking status: Former Smoker -- 0.5 packs/day for 6 years    Types: Cigarettes    Quit date: 07/16/1958  . Smokeless tobacco: Current User    Types: Chew   Comment: chews 1 pack tobacco per day  . Alcohol Use: Not on file  . Drug Use: Not on file  . Sexually Active: Not on file   Other Topics Concern  . Not on file   Social History Narrative   Regularly exercise.     Family History  Problem Relation Age of Onset  . Coronary artery disease Other     Past Medical History  Diagnosis Date  . Hypertension   . Hyperlipidemia   . CAD (coronary artery disease)     cath 2004...cutting ballooon...distal LAD  . Diabetes mellitus    borderline  . Tobacco abuse     Chewing tobacco  . Abdominal ultrasound, abnormal     2010, no evidence of abdominal aortic aneurysm  . Ejection fraction     Normal by history  . Carotid artery disease     Doppler, July, 2011, scattered calcific plaque particularly at the bulb s, no significant carotid artery stenosis    No past surgical history on file.  ROS   Patient denies fever, chills, headache, sweats, rash, change in vision, change in hearing, chest pain, cough, nausea vomiting, urinary symptoms. All other systems are reviewed and are negative.  PHYSICAL EXAM   Patient is oriented to person time and place. Affect is normal. Head is atraumatic. There is no xanthelasma. There is no jugulovenous distention. Lungs are clear. Respiratory effort is nonlabored. Cardiac exam reveals S1 and S2. There no clicks or significant murmurs. The abdomen is soft. There is no peripheral edema.  Filed Vitals:   01/25/12 1026  BP: 119/63  Pulse: 55  Height: 5' 10.5" (1.791 m)  Weight: 161 lb 12.8 oz (73.392 kg)   EKG is done today and reviewed by me.  There is mild sinus bradycardia. Otherwise the tracing is normal.  ASSESSMENT & PLAN

## 2012-03-07 DIAGNOSIS — C61 Malignant neoplasm of prostate: Secondary | ICD-10-CM | POA: Diagnosis not present

## 2012-03-14 DIAGNOSIS — C61 Malignant neoplasm of prostate: Secondary | ICD-10-CM | POA: Diagnosis not present

## 2012-04-17 ENCOUNTER — Other Ambulatory Visit: Payer: Self-pay | Admitting: Cardiology

## 2012-06-08 DIAGNOSIS — J189 Pneumonia, unspecified organism: Secondary | ICD-10-CM | POA: Diagnosis not present

## 2012-06-10 DIAGNOSIS — J189 Pneumonia, unspecified organism: Secondary | ICD-10-CM | POA: Diagnosis not present

## 2012-06-26 DIAGNOSIS — I2510 Atherosclerotic heart disease of native coronary artery without angina pectoris: Secondary | ICD-10-CM | POA: Diagnosis not present

## 2012-06-26 DIAGNOSIS — I10 Essential (primary) hypertension: Secondary | ICD-10-CM | POA: Diagnosis not present

## 2012-06-26 DIAGNOSIS — Z79899 Other long term (current) drug therapy: Secondary | ICD-10-CM | POA: Diagnosis not present

## 2012-06-26 DIAGNOSIS — E7800 Pure hypercholesterolemia, unspecified: Secondary | ICD-10-CM | POA: Diagnosis not present

## 2012-12-24 DIAGNOSIS — H52229 Regular astigmatism, unspecified eye: Secondary | ICD-10-CM | POA: Diagnosis not present

## 2012-12-24 DIAGNOSIS — H35379 Puckering of macula, unspecified eye: Secondary | ICD-10-CM | POA: Diagnosis not present

## 2012-12-24 DIAGNOSIS — Z961 Presence of intraocular lens: Secondary | ICD-10-CM | POA: Diagnosis not present

## 2012-12-24 DIAGNOSIS — H5231 Anisometropia: Secondary | ICD-10-CM | POA: Diagnosis not present

## 2013-01-06 ENCOUNTER — Encounter: Payer: Self-pay | Admitting: Cardiology

## 2013-01-06 DIAGNOSIS — Z0000 Encounter for general adult medical examination without abnormal findings: Secondary | ICD-10-CM | POA: Diagnosis not present

## 2013-01-06 DIAGNOSIS — I10 Essential (primary) hypertension: Secondary | ICD-10-CM | POA: Diagnosis not present

## 2013-01-06 DIAGNOSIS — I2510 Atherosclerotic heart disease of native coronary artery without angina pectoris: Secondary | ICD-10-CM | POA: Diagnosis not present

## 2013-01-06 DIAGNOSIS — E7800 Pure hypercholesterolemia, unspecified: Secondary | ICD-10-CM | POA: Diagnosis not present

## 2013-01-06 DIAGNOSIS — Z79899 Other long term (current) drug therapy: Secondary | ICD-10-CM | POA: Diagnosis not present

## 2013-01-25 ENCOUNTER — Encounter: Payer: Self-pay | Admitting: Cardiology

## 2013-01-26 ENCOUNTER — Encounter: Payer: Self-pay | Admitting: Cardiology

## 2013-01-26 ENCOUNTER — Ambulatory Visit (INDEPENDENT_AMBULATORY_CARE_PROVIDER_SITE_OTHER): Payer: Medicare Other | Admitting: Cardiology

## 2013-01-26 VITALS — BP 122/73 | HR 57 | Ht 70.0 in | Wt 160.4 lb

## 2013-01-26 DIAGNOSIS — I2510 Atherosclerotic heart disease of native coronary artery without angina pectoris: Secondary | ICD-10-CM | POA: Diagnosis not present

## 2013-01-26 DIAGNOSIS — I10 Essential (primary) hypertension: Secondary | ICD-10-CM | POA: Diagnosis not present

## 2013-01-26 NOTE — Progress Notes (Signed)
HPI The patient returns for a one-year followup. I saw him last July, 2013. He had a coronary intervention in 2004. He did have symptoms at that time. He has not had any symptoms since then. Each year when I see him I reminded him that he has not had any followup cardiac studies. He says he does not want to have any unless I feel it is absolutely necessary. He is stable and not having symptoms. He is on appropriate medications.  No Known Allergies  Current Outpatient Prescriptions  Medication Sig Dispense Refill  . aspirin (ASPIR-LOW) 81 MG EC tablet Take 81 mg by mouth daily.        Marland Kitchen atorvastatin (LIPITOR) 80 MG tablet Take 80 mg by mouth daily.        . metoprolol succinate (TOPROL-XL) 100 MG 24 hr tablet TAKE 1 TABLET BY MOUTH ONCE A DAY  90 tablet  2  . nitroGLYCERIN (NITROSTAT) 0.4 MG SL tablet Place 1 tablet (0.4 mg total) under the tongue every 5 (five) minutes as needed.  25 tablet  3  . ramipril (ALTACE) 2.5 MG capsule Take 2.5 mg by mouth daily.         No current facility-administered medications for this visit.    History   Social History  . Marital Status: Married    Spouse Name: N/A    Number of Children: N/A  . Years of Education: N/A   Occupational History  . Not on file.   Social History Main Topics  . Smoking status: Former Smoker -- 0.50 packs/day for 6 years    Types: Cigarettes    Quit date: 07/16/1958  . Smokeless tobacco: Current User    Types: Chew     Comment: chews 1 pack tobacco per day  . Alcohol Use: Not on file  . Drug Use: Not on file  . Sexually Active: Not on file   Other Topics Concern  . Not on file   Social History Narrative   Regularly exercise.     Family History  Problem Relation Age of Onset  . Coronary artery disease Other     Past Medical History  Diagnosis Date  . Hypertension   . Hyperlipidemia   . CAD (coronary artery disease)     cath 2004...cutting ballooon...distal LAD  . Diabetes mellitus     borderline  .  Tobacco abuse     Chewing tobacco  . Abdominal ultrasound, abnormal     2010, no evidence of abdominal aortic aneurysm  . Ejection fraction     Normal by history  . Carotid artery disease     Doppler, July, 2011, scattered calcific plaque particularly at the bulb s, no significant carotid artery stenosis    History reviewed. No pertinent past surgical history.  Patient Active Problem List   Diagnosis Date Noted  . Hypertension   . Hyperlipidemia   . CAD (coronary artery disease)   . Diabetes mellitus   . Tobacco abuse   . Ejection fraction   . Carotid artery disease     ROS   Patient denies fever, chills, headache, sweats, rash, change in vision, change in hearing, chest pain, cough, nausea vomiting, urinary symptoms. All other systems are reviewed and are negative.  PHYSICAL EXAM  Patient is oriented to person time and place. Affect is normal. There is no jugulovenous distention. There are no carotid bruits. Lungs are clear. Respiratory effort is nonlabored. Cardiac exam reveals S1 and S2. There no clicks or  significant murmurs. The abdomen is soft. There is no peripheral edema. There are no musculoskeletal deformities. There are no skin rashes.  Filed Vitals:   01/26/13 1038  BP: 122/73  Pulse: 57  Height: 5\' 10"  (1.778 m)  Weight: 160 lb 6.4 oz (72.757 kg)   EKG is done today and reviewed by me. There is no significant abnormality. There is no change from the past.  ASSESSMENT & PLAN

## 2013-01-26 NOTE — Patient Instructions (Addendum)

## 2013-01-26 NOTE — Assessment & Plan Note (Signed)
His coronary status is stable. No further workup is to be done at this time. He is on aspirin and blood pressure medications and high dose Lipitor. Plan followup in one year.

## 2013-01-26 NOTE — Assessment & Plan Note (Signed)
Blood pressure is controlled. No change in therapy. 

## 2013-02-18 ENCOUNTER — Other Ambulatory Visit: Payer: Self-pay

## 2013-03-24 DIAGNOSIS — C61 Malignant neoplasm of prostate: Secondary | ICD-10-CM | POA: Diagnosis not present

## 2013-03-31 DIAGNOSIS — C61 Malignant neoplasm of prostate: Secondary | ICD-10-CM | POA: Diagnosis not present

## 2013-04-08 DIAGNOSIS — H35379 Puckering of macula, unspecified eye: Secondary | ICD-10-CM | POA: Diagnosis not present

## 2013-05-21 ENCOUNTER — Other Ambulatory Visit: Payer: Self-pay

## 2013-07-22 DIAGNOSIS — H35379 Puckering of macula, unspecified eye: Secondary | ICD-10-CM | POA: Diagnosis not present

## 2013-07-22 DIAGNOSIS — Z961 Presence of intraocular lens: Secondary | ICD-10-CM | POA: Diagnosis not present

## 2013-08-21 DIAGNOSIS — E7800 Pure hypercholesterolemia, unspecified: Secondary | ICD-10-CM | POA: Diagnosis not present

## 2013-08-21 DIAGNOSIS — I2510 Atherosclerotic heart disease of native coronary artery without angina pectoris: Secondary | ICD-10-CM | POA: Diagnosis not present

## 2013-08-21 DIAGNOSIS — I10 Essential (primary) hypertension: Secondary | ICD-10-CM | POA: Diagnosis not present

## 2013-09-24 DIAGNOSIS — C61 Malignant neoplasm of prostate: Secondary | ICD-10-CM | POA: Diagnosis not present

## 2013-10-01 DIAGNOSIS — C61 Malignant neoplasm of prostate: Secondary | ICD-10-CM | POA: Diagnosis not present

## 2013-12-09 DIAGNOSIS — I10 Essential (primary) hypertension: Secondary | ICD-10-CM | POA: Diagnosis not present

## 2013-12-09 DIAGNOSIS — E7800 Pure hypercholesterolemia, unspecified: Secondary | ICD-10-CM | POA: Diagnosis not present

## 2013-12-28 ENCOUNTER — Encounter: Payer: Self-pay | Admitting: Cardiology

## 2013-12-28 ENCOUNTER — Ambulatory Visit (INDEPENDENT_AMBULATORY_CARE_PROVIDER_SITE_OTHER): Payer: Medicare Other | Admitting: Cardiology

## 2013-12-28 VITALS — BP 113/70 | HR 56 | Ht 71.0 in | Wt 165.4 lb

## 2013-12-28 DIAGNOSIS — I2510 Atherosclerotic heart disease of native coronary artery without angina pectoris: Secondary | ICD-10-CM | POA: Diagnosis not present

## 2013-12-28 DIAGNOSIS — I739 Peripheral vascular disease, unspecified: Secondary | ICD-10-CM

## 2013-12-28 DIAGNOSIS — I10 Essential (primary) hypertension: Secondary | ICD-10-CM

## 2013-12-28 DIAGNOSIS — E785 Hyperlipidemia, unspecified: Secondary | ICD-10-CM | POA: Diagnosis not present

## 2013-12-28 DIAGNOSIS — I779 Disorder of arteries and arterioles, unspecified: Secondary | ICD-10-CM

## 2013-12-28 DIAGNOSIS — F17200 Nicotine dependence, unspecified, uncomplicated: Secondary | ICD-10-CM

## 2013-12-28 DIAGNOSIS — Z720 Tobacco use: Secondary | ICD-10-CM

## 2013-12-28 NOTE — Progress Notes (Signed)
Patient ID: Randy Moyer, male   DOB: Nov 01, 1930, 80 y.o.   MRN: [id number]    HPI  Patient is seen in followup coronary disease. I saw him one year ago. He is 80 years old and looks remarkably good. He is fully active. He is having no significant problems. He had a coronary intervention in 2004. Historically he has and cysts that we not do any procedures unless they're absolutely necessary. Today he brought copies of labs to me. His lipids are excellent. He is on high-dose atorvastatin.  No Known Allergies  Current Outpatient Prescriptions  Medication Sig Dispense Refill  . aspirin (ASPIR-LOW) 81 MG EC tablet Take 81 mg by mouth daily.        Marland Kitchen atorvastatin (LIPITOR) 80 MG tablet Take 80 mg by mouth daily.        . metoprolol succinate (TOPROL-XL) 100 MG 24 hr tablet TAKE 1 TABLET BY MOUTH ONCE A DAY  90 tablet  2  . nitroGLYCERIN (NITROSTAT) 0.4 MG SL tablet Place 1 tablet (0.4 mg total) under the tongue every 5 (five) minutes as needed.  25 tablet  3  . ramipril (ALTACE) 2.5 MG capsule Take 2.5 mg by mouth daily.         No current facility-administered medications for this visit.    History   Social History  . Marital Status: Married    Spouse Name: N/A    Number of Children: N/A  . Years of Education: N/A   Occupational History  . Not on file.   Social History Main Topics  . Smoking status: Former Smoker -- 0.50 packs/day for 6 years    Types: Cigarettes    Quit date: 07/16/1958  . Smokeless tobacco: Current User    Types: Chew     Comment: chews 1 pack tobacco per day  . Alcohol Use: Not on file  . Drug Use: Not on file  . Sexual Activity: Not on file   Other Topics Concern  . Not on file   Social History Narrative   Regularly exercise.     Family History  Problem Relation Age of Onset  . Coronary artery disease Other     Past Medical History  Diagnosis Date  . Hypertension   . Hyperlipidemia   . CAD (coronary artery disease)     cath 2004...cutting  ballooon...distal LAD  . Diabetes mellitus     borderline  . Tobacco abuse     Chewing tobacco  . Abdominal ultrasound, abnormal     2010, no evidence of abdominal aortic aneurysm  . Ejection fraction     Normal by history  . Carotid artery disease     Doppler, July, 2011, scattered calcific plaque particularly at the bulb s, no significant carotid artery stenosis    History reviewed. No pertinent past surgical history.  Patient Active Problem List   Diagnosis Date Noted  . Hypertension   . Hyperlipidemia   . CAD (coronary artery disease)   . Diabetes mellitus   . Tobacco abuse   . Ejection fraction   . Carotid artery disease     ROS   Patient denies fever, chills, headache, sweats, rash, change in vision, change in hearing, chest pain, cough, nausea vomiting, urinary symptoms. All other systems are reviewed and are negative.  PHYSICAL EXAM  Patient is oriented to person time and place. Affect is normal. Head is atraumatic. Sclera and conjunctiva are normal. There is no jugulovenous distention. Lungs are clear. Respiratory effort  is nonlabored. Cardiac exam reveals S1 and S2. Abdomen is soft. Is no peripheral edema. There no musculoskeletal deformities. There are no skin rashes. His skin is tanned.  Filed Vitals:   12/28/13 1303  BP: 113/70  Pulse: 56  Height: 5\' 11"  (1.803 m)  Weight: 165 lb 6.4 oz (75.025 kg)  SpO2: 98%   EKG is done today and reviewed by me. EKG is normal. There is no change from the past.  ASSESSMENT & PLAN

## 2013-12-28 NOTE — Assessment & Plan Note (Signed)
He continues to chew tobacco. I have counseled him to stop. I suspect he has no intention of stopping.

## 2013-12-28 NOTE — Assessment & Plan Note (Signed)
His lipids are excellent. No change in therapy.

## 2013-12-28 NOTE — Assessment & Plan Note (Signed)
Coronary disease is stable.  No further workup. 

## 2013-12-28 NOTE — Assessment & Plan Note (Signed)
We know he has some mild carotid disease in the past. There was no significant stenosis. He prefers not to have any followup tests unless necessary. I am comfortable with that at this time.

## 2013-12-28 NOTE — Assessment & Plan Note (Signed)
Blood pressure is controlled. No change in therapy. 

## 2013-12-28 NOTE — Patient Instructions (Signed)

## 2014-01-21 DIAGNOSIS — H35379 Puckering of macula, unspecified eye: Secondary | ICD-10-CM | POA: Diagnosis not present

## 2014-01-21 DIAGNOSIS — H524 Presbyopia: Secondary | ICD-10-CM | POA: Diagnosis not present

## 2014-01-21 DIAGNOSIS — Z961 Presence of intraocular lens: Secondary | ICD-10-CM | POA: Diagnosis not present

## 2014-04-06 DIAGNOSIS — C61 Malignant neoplasm of prostate: Secondary | ICD-10-CM | POA: Diagnosis not present

## 2014-04-13 ENCOUNTER — Other Ambulatory Visit (HOSPITAL_COMMUNITY): Payer: Self-pay | Admitting: Urology

## 2014-04-13 DIAGNOSIS — C61 Malignant neoplasm of prostate: Secondary | ICD-10-CM | POA: Diagnosis not present

## 2014-04-20 ENCOUNTER — Encounter (HOSPITAL_COMMUNITY)
Admission: RE | Admit: 2014-04-20 | Discharge: 2014-04-20 | Disposition: A | Discharge status: Home / Self Care | Payer: Medicare Other | Source: Ambulatory Visit | Attending: Urology | Admitting: Urology

## 2014-04-20 DIAGNOSIS — Z8546 Personal history of malignant neoplasm of prostate: Secondary | ICD-10-CM | POA: Diagnosis not present

## 2014-04-20 DIAGNOSIS — C61 Malignant neoplasm of prostate: Secondary | ICD-10-CM

## 2014-04-20 MED ORDER — TECHNETIUM TC 99M MEDRONATE IV KIT
27.5000 | PACK | Freq: Once | INTRAVENOUS | Status: AC | PRN
  Administered 2014-04-20: 27.5 via INTRAVENOUS

## 2014-04-30 ENCOUNTER — Other Ambulatory Visit: Payer: Self-pay

## 2014-05-06 DIAGNOSIS — H3531 Nonexudative age-related macular degeneration: Secondary | ICD-10-CM | POA: Diagnosis not present

## 2014-05-06 DIAGNOSIS — H35372 Puckering of macula, left eye: Secondary | ICD-10-CM | POA: Diagnosis not present

## 2014-05-06 DIAGNOSIS — Z961 Presence of intraocular lens: Secondary | ICD-10-CM | POA: Diagnosis not present

## 2014-05-06 DIAGNOSIS — H43822 Vitreomacular adhesion, left eye: Secondary | ICD-10-CM | POA: Diagnosis not present

## 2014-08-23 ENCOUNTER — Encounter: Payer: Self-pay | Admitting: Cardiology

## 2014-08-23 DIAGNOSIS — I10 Essential (primary) hypertension: Secondary | ICD-10-CM | POA: Diagnosis not present

## 2014-08-23 DIAGNOSIS — R252 Cramp and spasm: Secondary | ICD-10-CM | POA: Diagnosis not present

## 2014-08-23 DIAGNOSIS — E780 Pure hypercholesterolemia: Secondary | ICD-10-CM | POA: Diagnosis not present

## 2014-10-12 DIAGNOSIS — C61 Malignant neoplasm of prostate: Secondary | ICD-10-CM | POA: Diagnosis not present

## 2014-10-19 DIAGNOSIS — C61 Malignant neoplasm of prostate: Secondary | ICD-10-CM | POA: Diagnosis not present

## 2015-01-10 ENCOUNTER — Other Ambulatory Visit: Payer: Self-pay

## 2015-02-10 DIAGNOSIS — H35341 Macular cyst, hole, or pseudohole, right eye: Secondary | ICD-10-CM | POA: Diagnosis not present

## 2015-02-10 DIAGNOSIS — Z961 Presence of intraocular lens: Secondary | ICD-10-CM | POA: Diagnosis not present

## 2015-02-10 DIAGNOSIS — H31011 Macula scars of posterior pole (postinflammatory) (post-traumatic), right eye: Secondary | ICD-10-CM | POA: Diagnosis not present

## 2015-02-10 DIAGNOSIS — H35372 Puckering of macula, left eye: Secondary | ICD-10-CM | POA: Diagnosis not present

## 2015-03-16 ENCOUNTER — Encounter: Payer: Self-pay | Admitting: Cardiology

## 2015-03-16 ENCOUNTER — Ambulatory Visit (INDEPENDENT_AMBULATORY_CARE_PROVIDER_SITE_OTHER): Payer: Medicare Other | Admitting: Cardiology

## 2015-03-16 VITALS — BP 126/66 | HR 56 | Ht 71.0 in | Wt 158.8 lb

## 2015-03-16 DIAGNOSIS — I2510 Atherosclerotic heart disease of native coronary artery without angina pectoris: Secondary | ICD-10-CM

## 2015-03-16 DIAGNOSIS — E785 Hyperlipidemia, unspecified: Secondary | ICD-10-CM

## 2015-03-16 DIAGNOSIS — I779 Disorder of arteries and arterioles, unspecified: Secondary | ICD-10-CM | POA: Diagnosis not present

## 2015-03-16 DIAGNOSIS — Z720 Tobacco use: Secondary | ICD-10-CM

## 2015-03-16 DIAGNOSIS — I739 Peripheral vascular disease, unspecified: Secondary | ICD-10-CM

## 2015-03-16 NOTE — Assessment & Plan Note (Signed)
Patient continues to chew tobacco. I encouraged him to stop.

## 2015-03-16 NOTE — Assessment & Plan Note (Signed)
Carotid Doppler in 2011 revealed only slight plaque with no significant stenosis. He is on appropriate therapy. He prefers not to have any type of testing unless there is a definite clinical indication.

## 2015-03-16 NOTE — Assessment & Plan Note (Signed)
Coronary disease is stable. He had a cutting balloon procedure to the distal LAD in 2004. He has no symptoms. We have not done any type of exercise testing since that time. He is active. He does not want any type of testing unless he has significant symptoms. His EKG remains normal. He is on appropriate therapy including aspirin and high-dose statin.

## 2015-03-16 NOTE — Patient Instructions (Signed)
Your physician recommends that you continue on your current medications as directed. Please refer to the Current Medication list given to you today. Your physician recommends that you schedule a follow-up appointment in: 1 year with Dr. McDowell. You will receive a reminder letter in the mail in about 10 months reminding you to call and schedule your appointment. If you don't receive this letter, please contact our office. 

## 2015-03-16 NOTE — Assessment & Plan Note (Signed)
His LDL is 64 on 80mg  of atorvastatin. No change in therapy. He says that he has some vague muscle cramps at times. There is nothing in the history that would cause me to change his atorvastatin at this time.

## 2015-03-16 NOTE — Progress Notes (Signed)
Cardiology Office Note   Date:  03/16/2015   ID:  Randy Moyer, DOB 1930-07-18, MRN [id number]  PCP:   Melinda Crutch, MD  Cardiologist:  Dola Argyle, MD   Chief Complaint  Patient presents with  . Appointment    Follow-up coronary disease      History of Present Illness: Randy Moyer is a 81 y.o. male who presents today to follow up coronary disease. I saw him last June, 2015. He looks great. He continues to be quite active. He has no symptoms. He had a coronary intervention in 2004. He always makes it clear that he does not want to have any type of cardiac testing unless he is having symptoms or problems. He is quite stable at this time. His LDL is 64 on high dose atorvastatin.    Past Medical History  Diagnosis Date  . Hypertension   . Hyperlipidemia   . CAD (coronary artery disease)     cath 2004...cutting ballooon...distal LAD  . Diabetes mellitus     borderline  . Tobacco abuse     Chewing tobacco  . Abdominal ultrasound, abnormal     2010, no evidence of abdominal aortic aneurysm  . Ejection fraction     Normal by history  . Carotid artery disease     Doppler, July, 2011, scattered calcific plaque particularly at the bulb s, no significant carotid artery stenosis    History reviewed. No pertinent past surgical history.  Patient Active Problem List   Diagnosis Date Noted  . Hypertension   . Hyperlipidemia   . CAD (coronary artery disease)   . Diabetes mellitus   . Tobacco abuse   . Ejection fraction   . Carotid artery disease       Current Outpatient Prescriptions  Medication Sig Dispense Refill  . aspirin (ASPIR-LOW) 81 MG EC tablet Take 81 mg by mouth daily.      Marland Kitchen atorvastatin (LIPITOR) 80 MG tablet Take 80 mg by mouth daily.      . metoprolol succinate (TOPROL-XL) 100 MG 24 hr tablet TAKE 1 TABLET BY MOUTH ONCE A DAY 90 tablet 2  . nitroGLYCERIN (NITROSTAT) 0.4 MG SL tablet Place 1 tablet (0.4 mg total) under the tongue every 5 (five) minutes as  needed. 25 tablet 3  . ramipril (ALTACE) 2.5 MG capsule Take 2.5 mg by mouth daily.       No current facility-administered medications for this visit.    Allergies:   Review of patient's allergies indicates no known allergies.    Social History:  The patient  reports that he quit smoking about 56 years ago. His smoking use included Cigarettes. He has a 3 pack-year smoking history. His smokeless tobacco use includes Chew.   Family History:  The patient's family history includes Coronary artery disease in his other.    ROS:  Please see the history of present illness.     Patient denies fever, chills, headache, sweats, rash, change in vision, change in hearing, chest pain, cough, nausea or vomiting, urinary symptoms. All other systems are reviewed and are negative.   PHYSICAL EXAM: VS:  BP 126/66 mmHg  Pulse 56  Ht 5\' 11"  (1.803 m)  Wt 158 lb 12.8 oz (72.031 kg)  BMI 22.16 kg/m2  SpO2 100% , Patient is oriented to person time and place. Affect is normal. He is quite stable. Head is atraumatic. Sclera and conjunctiva are normal. There is no jugulovenous distention. Lungs are clear. Respiratory effort is nonlabored.  Cardiac exam reveals S1 and S2. The abdomen is soft. There is no peripheral edema. There are no musculoskeletal deformities. No skin rashes. Neurologic is grossly intact.   EKG:   EKG is done today and reviewed by me. There is mild sinus bradycardia. The QRS is normal. There is no significant change. Recent Labs: No results found for requested labs within last 365 days.    Lipid Panel No results found for: CHOL, TRIG, HDL, CHOLHDL, VLDL, LDLCALC, LDLDIRECT    Wt Readings from Last 3 Encounters:  03/16/15 158 lb 12.8 oz (72.031 kg)  12/28/13 165 lb 6.4 oz (75.025 kg)  01/26/13 160 lb 6.4 oz (72.757 kg)      Current medicines are reviewed  The patient understands his medications.   ASSESSMENT AND PLAN:

## 2015-05-20 DIAGNOSIS — C61 Malignant neoplasm of prostate: Secondary | ICD-10-CM | POA: Diagnosis not present

## 2015-05-24 DIAGNOSIS — C61 Malignant neoplasm of prostate: Secondary | ICD-10-CM | POA: Diagnosis not present

## 2015-09-05 DIAGNOSIS — Z0000 Encounter for general adult medical examination without abnormal findings: Secondary | ICD-10-CM | POA: Diagnosis not present

## 2015-09-05 DIAGNOSIS — E7800 Pure hypercholesterolemia, unspecified: Secondary | ICD-10-CM | POA: Diagnosis not present

## 2015-09-05 DIAGNOSIS — Z23 Encounter for immunization: Secondary | ICD-10-CM | POA: Diagnosis not present

## 2015-09-05 DIAGNOSIS — C61 Malignant neoplasm of prostate: Secondary | ICD-10-CM | POA: Diagnosis not present

## 2015-09-05 DIAGNOSIS — M25519 Pain in unspecified shoulder: Secondary | ICD-10-CM | POA: Diagnosis not present

## 2015-09-05 DIAGNOSIS — I10 Essential (primary) hypertension: Secondary | ICD-10-CM | POA: Diagnosis not present

## 2015-11-18 DIAGNOSIS — C61 Malignant neoplasm of prostate: Secondary | ICD-10-CM | POA: Diagnosis not present

## 2015-11-18 DIAGNOSIS — H6123 Impacted cerumen, bilateral: Secondary | ICD-10-CM | POA: Diagnosis not present

## 2015-11-18 DIAGNOSIS — H900 Conductive hearing loss, bilateral: Secondary | ICD-10-CM | POA: Diagnosis not present

## 2015-11-25 DIAGNOSIS — Z0000 Encounter for general adult medical examination without abnormal findings: Secondary | ICD-10-CM | POA: Diagnosis not present

## 2015-11-25 DIAGNOSIS — C61 Malignant neoplasm of prostate: Secondary | ICD-10-CM | POA: Diagnosis not present

## 2016-02-01 DIAGNOSIS — H35341 Macular cyst, hole, or pseudohole, right eye: Secondary | ICD-10-CM | POA: Diagnosis not present

## 2016-02-01 DIAGNOSIS — H35372 Puckering of macula, left eye: Secondary | ICD-10-CM | POA: Diagnosis not present

## 2016-02-01 DIAGNOSIS — Z961 Presence of intraocular lens: Secondary | ICD-10-CM | POA: Diagnosis not present

## 2016-02-01 DIAGNOSIS — H31011 Macula scars of posterior pole (postinflammatory) (post-traumatic), right eye: Secondary | ICD-10-CM | POA: Diagnosis not present

## 2016-05-24 ENCOUNTER — Encounter: Payer: Self-pay | Admitting: Cardiology

## 2016-05-24 NOTE — Progress Notes (Signed)
Cardiology Office Note  Date: 05/30/2016   ID: Randy Moyer, DOB [birth year], MRN [id number]  PCP: Melinda Crutch, MD  Evaluating Cardiologist: Rozann Lesches, MD   Chief Complaint  Patient presents with  . Coronary Artery Disease    History of Present Illness: Randy Moyer is an 82 y.o. male former patient of Dr. Ron Parker last seen in August 2016, now establishing follow-up with me. I reviewed his records and updated the chart. He presents for a routine follow-up visit, reports no angina symptoms or significant change in stamina. He works three days a week for [employer].  I reviewed Dr. Ron Parker prior notes, patient has preferred conservative follow-up on medical therapy without specific cardiac testing. We discussed this today, and he continues to prefer observation. Current medications include aspirin, Lipitor, Toprol-XL, Altace, and as needed nitroglycerin.  I reviewed his ECG today which shows normal sinus rhythm with PVC.  He follows with Dr. Harrington Challenger for primary care and is due for a visit with lab work. Cholesterol was well-controlled last year with LDL 64.  Past Medical History:  Diagnosis Date  . Borderline diabetes mellitus   . CAD (coronary artery disease)    Cutting balloon angioplasty to distal LAD 2004  . Essential hypertension   . Hyperlipidemia     Past Surgical History:  Procedure Laterality Date  . CORONARY ANGIOPLASTY WITH STENT PLACEMENT    . HERNIA REPAIR     in the 1950's  . TONSILLECTOMY     age 15 or 79    Current Outpatient Prescriptions  Medication Sig Dispense Refill  . aspirin (ASPIR-LOW) 81 MG EC tablet Take 81 mg by mouth daily.      Marland Kitchen atorvastatin (LIPITOR) 80 MG tablet Take 80 mg by mouth daily.      . metoprolol succinate (TOPROL-XL) 100 MG 24 hr tablet TAKE 1 TABLET BY MOUTH ONCE A DAY 90 tablet 2  . nitroGLYCERIN (NITROSTAT) 0.4 MG SL tablet Place 1 tablet (0.4 mg total) under the tongue every 5 (five) minutes as needed. 25 tablet 3  .  ramipril (ALTACE) 2.5 MG capsule Take 2.5 mg by mouth daily.       No current facility-administered medications for this visit.    Allergies:  Patient has no known allergies.   Social History: The patient  reports that he quit smoking about 57 years ago. His smoking use included Cigarettes. He started smoking about 60 years ago. He has a 3.00 pack-year smoking history. His smokeless tobacco use includes Chew.   Family History: The patient's family history includes Coronary artery disease in his other.   ROS:  Please see the history of present illness. Otherwise, complete review of systems is positive for erectile dysfunction.  All other systems are reviewed and negative.   Physical Exam: VS:  BP (!) 150/83   Pulse 63   Ht 5\' 11"  (1.803 m)   Wt 157 lb (71.2 kg)   BMI 21.90 kg/m , BMI Body mass index is 21.9 kg/m.  Wt Readings from Last 3 Encounters:  05/30/16 157 lb (71.2 kg)  03/16/15 158 lb 12.8 oz (72 kg)  12/28/13 165 lb 6.4 oz (75 kg)    General: Patient appears comfortable at rest. HEENT: Conjunctiva and lids normal, oropharynx clear with moist mucosa. Neck: Supple, no elevated JVP or carotid bruits, no thyromegaly. Lungs: Clear to auscultation, nonlabored breathing at rest. Cardiac: Regular rate and rhythm, no S3, soft systolic murmur, no pericardial rub. Abdomen: Soft,  nontender, bowel sounds present, no guarding or rebound. Extremities: No pitting edema, distal pulses 2+. Skin: Warm and dry. Musculoskeletal: No kyphosis. Neuropsychiatric: Alert and oriented x3, affect grossly appropriate.  ECG: I personally reviewed the tracing from 03/16/2015 which showed sinus bradycardia.  Recent Labwork:  February 2016: BUN 24, creatinine 1.1, potassium 4.8, AST 14, ALT 13, cholesterol 127, triglycerides 57, HDL 52, LDL 64  Assessment and Plan:  1. Symptomatically stable CAD status post angioplasty of the distal LAD in 2004. He remains comfortable with observation on medical  therapy without specific ischemic testing. We discussed warning signs and symptoms. ECG reviewed and stable. He is not having to use any nitroglycerin at this time.  2. Erectile dysfunction. States that he plans to walk with Dr. Harrington Challenger about trying something like Viagra. No direct cardiac contraindication at this point based on symptoms, he is not on long-acting nitrates.  3. Hyperlipidemia, on Lipitor. LDL was 64 last year.  4. Essential hypertension, blood pressure is mildly elevated today. He reports compliance with Toprol-XL and Altace. No changes made in present regimen. Keep follow-up with Dr. Harrington Challenger.  Current medicines were reviewed with the patient today.   Orders Placed This Encounter  Procedures  . EKG 12-Lead    Disposition: Follow-up in one year, sooner if needed.  Signed, Satira Sark, MD, [facility] 05/30/2016 8:17 AM    [facility] [place]. [address], [place], [place] [address] Phone: [phone]; Fax: [phone]

## 2016-05-28 DIAGNOSIS — C61 Malignant neoplasm of prostate: Secondary | ICD-10-CM | POA: Diagnosis not present

## 2016-05-30 ENCOUNTER — Encounter: Payer: Self-pay | Admitting: Cardiology

## 2016-05-30 ENCOUNTER — Ambulatory Visit (INDEPENDENT_AMBULATORY_CARE_PROVIDER_SITE_OTHER): Payer: Medicare Other | Admitting: Cardiology

## 2016-05-30 VITALS — BP 150/83 | HR 63 | Ht 71.0 in | Wt 157.0 lb

## 2016-05-30 DIAGNOSIS — I10 Essential (primary) hypertension: Secondary | ICD-10-CM

## 2016-05-30 DIAGNOSIS — E782 Mixed hyperlipidemia: Secondary | ICD-10-CM

## 2016-05-30 DIAGNOSIS — I2510 Atherosclerotic heart disease of native coronary artery without angina pectoris: Secondary | ICD-10-CM | POA: Diagnosis not present

## 2016-05-30 DIAGNOSIS — N529 Male erectile dysfunction, unspecified: Secondary | ICD-10-CM

## 2016-05-30 NOTE — Patient Instructions (Signed)

## 2016-06-04 DIAGNOSIS — N400 Enlarged prostate without lower urinary tract symptoms: Secondary | ICD-10-CM | POA: Diagnosis not present

## 2016-06-04 DIAGNOSIS — C61 Malignant neoplasm of prostate: Secondary | ICD-10-CM | POA: Diagnosis not present

## 2016-09-21 DIAGNOSIS — I2510 Atherosclerotic heart disease of native coronary artery without angina pectoris: Secondary | ICD-10-CM | POA: Diagnosis not present

## 2016-09-21 DIAGNOSIS — C61 Malignant neoplasm of prostate: Secondary | ICD-10-CM | POA: Diagnosis not present

## 2016-09-21 DIAGNOSIS — E7800 Pure hypercholesterolemia, unspecified: Secondary | ICD-10-CM | POA: Diagnosis not present

## 2016-09-21 DIAGNOSIS — N529 Male erectile dysfunction, unspecified: Secondary | ICD-10-CM | POA: Diagnosis not present

## 2016-09-21 DIAGNOSIS — I10 Essential (primary) hypertension: Secondary | ICD-10-CM | POA: Diagnosis not present

## 2016-09-21 DIAGNOSIS — Z0000 Encounter for general adult medical examination without abnormal findings: Secondary | ICD-10-CM | POA: Diagnosis not present

## 2016-11-26 DIAGNOSIS — C61 Malignant neoplasm of prostate: Secondary | ICD-10-CM | POA: Diagnosis not present

## 2016-12-03 DIAGNOSIS — C61 Malignant neoplasm of prostate: Secondary | ICD-10-CM | POA: Diagnosis not present

## 2016-12-03 DIAGNOSIS — N400 Enlarged prostate without lower urinary tract symptoms: Secondary | ICD-10-CM | POA: Diagnosis not present

## 2017-03-04 DIAGNOSIS — H31011 Macula scars of posterior pole (postinflammatory) (post-traumatic), right eye: Secondary | ICD-10-CM | POA: Diagnosis not present

## 2017-03-04 DIAGNOSIS — H35372 Puckering of macula, left eye: Secondary | ICD-10-CM | POA: Diagnosis not present

## 2017-03-04 DIAGNOSIS — H524 Presbyopia: Secondary | ICD-10-CM | POA: Diagnosis not present

## 2017-03-04 DIAGNOSIS — Z961 Presence of intraocular lens: Secondary | ICD-10-CM | POA: Diagnosis not present

## 2017-03-12 DIAGNOSIS — H6123 Impacted cerumen, bilateral: Secondary | ICD-10-CM | POA: Diagnosis not present

## 2017-06-03 NOTE — Progress Notes (Signed)
Cardiology Office Note  Date: 06/04/2017   ID: Randy Moyer, DOB 12-06-1930, MRN [id number]  PCP: Lawerance Cruel, MD  Primary Cardiologist: Rozann Lesches, MD   Chief Complaint  Patient presents with  . Coronary Artery Disease    History of Present Illness: Randy Moyer is an 83 y.o. male last seen in November 2017.  He presents for a routine follow-up visit.  He has had no angina symptoms or nitroglycerin use.  Stays active, enjoys working outdoors, also volunteering.  He does not report any definite change in stamina.  He has preferred overall conservative follow-up with no specific cardiac ischemic testing.  His current cardiac regimen includes aspirin, Toprol-XL, Altace, and as needed nitroglycerin.  He is not on a statin at this time.  Previous LDL was as low as 64.  He states that he is having a physical with lab work per PCP in February.  I personally reviewed his ECG from today which showed sinus bradycardia.  Past Medical History:  Diagnosis Date  . Borderline diabetes mellitus   . CAD (coronary artery disease)    Cutting balloon angioplasty to distal LAD 2004  . Essential hypertension   . Hyperlipidemia     Past Surgical History:  Procedure Laterality Date  . CORONARY ANGIOPLASTY WITH STENT PLACEMENT    . HERNIA REPAIR     in the 1950's  . TONSILLECTOMY     age 74 or 26    Current Outpatient Medications  Medication Sig Dispense Refill  . aspirin (ASPIR-LOW) 81 MG EC tablet Take 81 mg by mouth daily.      . metoprolol succinate (TOPROL-XL) 100 MG 24 hr tablet TAKE 1 TABLET BY MOUTH ONCE A DAY 90 tablet 2  . nitroGLYCERIN (NITROSTAT) 0.4 MG SL tablet Place 1 tablet (0.4 mg total) under the tongue every 5 (five) minutes as needed. 25 tablet 3  . ramipril (ALTACE) 2.5 MG capsule Take 2.5 mg by mouth daily.       No current facility-administered medications for this visit.    Allergies:  Patient has no known allergies.   Social History: The patient   reports that he quit smoking about 58 years ago. His smoking use included cigarettes. He started smoking about 61 years ago. He has a 3.00 pack-year smoking history. His smokeless tobacco use includes chew.   ROS:  Please see the history of present illness. Otherwise, complete review of systems is positive for none.  All other systems are reviewed and negative.   Physical Exam: VS:  BP 120/60   Pulse (!) 52   Ht 5\' 10"  (1.778 m)   Wt 157 lb (71.2 kg)   BMI 22.53 kg/m , BMI Body mass index is 22.53 kg/m.  Wt Readings from Last 3 Encounters:  06/04/17 157 lb (71.2 kg)  05/30/16 157 lb (71.2 kg)  03/16/15 158 lb 12.8 oz (72 kg)    General: Patient appears comfortable at rest. HEENT: Conjunctiva and lids normal, oropharynx clear. Neck: Supple, no elevated JVP or carotid bruits, no thyromegaly. Lungs: Clear to auscultation, nonlabored breathing at rest. Cardiac: Regular rate and rhythm, no S3, soft systolic murmur, no pericardial rub. Abdomen: Soft, nontender, bowel sounds present, no guarding or rebound. Extremities: No pitting edema, distal pulses 2+. Skin: Warm and dry. Musculoskeletal: No kyphosis. Neuropsychiatric: Alert and oriented x3, affect grossly appropriate.  ECG: I personally reviewed the tracing from 05/30/2016 which showed sinus rhythm with PVC.  Recent Labwork:  February 2016: BUN  24, creatinine 1.1, potassium 4.8, AST 14, ALT 13, cholesterol 127, triglycerides 57, HDL 52, LDL 64  Assessment and Plan:  1.  CAD with history of angioplasty to the distal LAD in 2004.  He reports no angina on medical therapy and prefers observation without ischemic testing.  I did review his ECG today which is stable.  No change in current regimen.  2.  Hyperlipidemia, previously on Lipitor.  He has follow-up planned with PCP in February for repeat lab work.  Would recommend continuing at least low-dose statin therapy from a cardiac perspective.  3.  Essential hypertension, blood  pressure is well controlled today.  4.  History of erectile dysfunction.  Current medicines were reviewed with the patient today.   Orders Placed This Encounter  Procedures  . EKG 12-Lead    Disposition: Follow-up in 1 year.  Signed, Satira Sark, MD, [facility] 06/04/2017 9:10 AM    [facility] [place], [place], [place] [address] Phone: [phone]; Fax: [phone]

## 2017-06-04 ENCOUNTER — Ambulatory Visit (INDEPENDENT_AMBULATORY_CARE_PROVIDER_SITE_OTHER): Payer: Medicare Other | Admitting: Cardiology

## 2017-06-04 ENCOUNTER — Encounter: Payer: Self-pay | Admitting: Cardiology

## 2017-06-04 VITALS — BP 120/60 | HR 52 | Ht 70.0 in | Wt 157.0 lb

## 2017-06-04 DIAGNOSIS — I2510 Atherosclerotic heart disease of native coronary artery without angina pectoris: Secondary | ICD-10-CM | POA: Diagnosis not present

## 2017-06-04 DIAGNOSIS — N529 Male erectile dysfunction, unspecified: Secondary | ICD-10-CM | POA: Diagnosis not present

## 2017-06-04 DIAGNOSIS — E782 Mixed hyperlipidemia: Secondary | ICD-10-CM

## 2017-06-04 DIAGNOSIS — I10 Essential (primary) hypertension: Secondary | ICD-10-CM | POA: Diagnosis not present

## 2017-06-04 NOTE — Patient Instructions (Signed)

## 2017-06-17 DIAGNOSIS — C61 Malignant neoplasm of prostate: Secondary | ICD-10-CM | POA: Diagnosis not present

## 2017-06-26 DIAGNOSIS — C61 Malignant neoplasm of prostate: Secondary | ICD-10-CM | POA: Diagnosis not present

## 2017-06-26 DIAGNOSIS — N400 Enlarged prostate without lower urinary tract symptoms: Secondary | ICD-10-CM | POA: Diagnosis not present

## 2017-10-23 DIAGNOSIS — Z0000 Encounter for general adult medical examination without abnormal findings: Secondary | ICD-10-CM | POA: Diagnosis not present

## 2017-10-23 DIAGNOSIS — I10 Essential (primary) hypertension: Secondary | ICD-10-CM | POA: Diagnosis not present

## 2017-10-23 DIAGNOSIS — Z1389 Encounter for screening for other disorder: Secondary | ICD-10-CM | POA: Diagnosis not present

## 2017-10-23 DIAGNOSIS — E7800 Pure hypercholesterolemia, unspecified: Secondary | ICD-10-CM | POA: Diagnosis not present

## 2017-12-26 DIAGNOSIS — C61 Malignant neoplasm of prostate: Secondary | ICD-10-CM | POA: Diagnosis not present

## 2018-01-09 DIAGNOSIS — C61 Malignant neoplasm of prostate: Secondary | ICD-10-CM | POA: Diagnosis not present

## 2018-01-09 DIAGNOSIS — R9721 Rising PSA following treatment for malignant neoplasm of prostate: Secondary | ICD-10-CM | POA: Diagnosis not present

## 2018-01-13 ENCOUNTER — Other Ambulatory Visit: Payer: Self-pay | Admitting: Urology

## 2018-01-13 DIAGNOSIS — C61 Malignant neoplasm of prostate: Secondary | ICD-10-CM

## 2018-01-21 ENCOUNTER — Encounter (HOSPITAL_COMMUNITY)
Admission: RE | Admit: 2018-01-21 | Discharge: 2018-01-21 | Disposition: A | Discharge status: Home / Self Care | Payer: Medicare Other | Source: Ambulatory Visit | Attending: Urology | Admitting: Urology

## 2018-01-21 DIAGNOSIS — C61 Malignant neoplasm of prostate: Secondary | ICD-10-CM | POA: Diagnosis not present

## 2018-01-21 MED ORDER — AXUMIN (FLUCICLOVINE F 18) INJECTION
10.3000 | Freq: Once | INTRAVENOUS | Status: AC
Start: 2018-01-21 — End: 2018-01-21
  Administered 2018-01-21: 10.3 via INTRAVENOUS

## 2018-02-11 DIAGNOSIS — N400 Enlarged prostate without lower urinary tract symptoms: Secondary | ICD-10-CM | POA: Diagnosis not present

## 2018-02-11 DIAGNOSIS — C61 Malignant neoplasm of prostate: Secondary | ICD-10-CM | POA: Diagnosis not present

## 2018-02-11 DIAGNOSIS — R9721 Rising PSA following treatment for malignant neoplasm of prostate: Secondary | ICD-10-CM | POA: Diagnosis not present

## 2018-02-18 DIAGNOSIS — H6123 Impacted cerumen, bilateral: Secondary | ICD-10-CM | POA: Diagnosis not present

## 2018-06-05 NOTE — Progress Notes (Signed)
Cardiology Office Note  Date: 06/06/2018   ID: KHIZAR FIORELLA, DOB 1931-04-12, MRN [id number]  PCP: Lawerance Cruel, MD  Primary Cardiologist: Rozann Lesches, MD   Chief Complaint  Patient presents with  . Coronary Artery Disease    History of Present Illness: Randy Moyer is an 84 y.o. male last seen in November 2018.  He is here for a routine follow-up visit.  He states that he feels well, has good stamina, no angina symptoms or nitroglycerin use.  He still has an old bottle and needs a refill.  He continues to enjoy indoor and outdoor work, remodeling a house at this time.  I reviewed his medications which are stable and outlined below.  He reports no intolerances.  He has not been on statin therapy for several years, previously on Lipitor.  We are requesting his interval lab work from PCP.  I personally reviewed his ECG today which shows sinus bradycardia.  Our plan has been to continue observation in the absence of progressive angina symptoms.  Past Medical History:  Diagnosis Date  . Borderline diabetes mellitus   . CAD (coronary artery disease)    Cutting balloon angioplasty to distal LAD 2004  . Essential hypertension   . Hyperlipidemia     Past Surgical History:  Procedure Laterality Date  . CORONARY ANGIOPLASTY WITH STENT PLACEMENT    . HERNIA REPAIR     in the 1950's  . TONSILLECTOMY     age 20 or 66    Current Outpatient Medications  Medication Sig Dispense Refill  . aspirin (ASPIR-LOW) 81 MG EC tablet Take 81 mg by mouth daily.      . metoprolol succinate (TOPROL-XL) 100 MG 24 hr tablet TAKE 1 TABLET BY MOUTH ONCE A DAY 90 tablet 2  . nitroGLYCERIN (NITROSTAT) 0.4 MG SL tablet Place 1 tablet (0.4 mg total) under the tongue every 5 (five) minutes as needed. 25 tablet 3  . ramipril (ALTACE) 2.5 MG capsule Take 2.5 mg by mouth daily.       No current facility-administered medications for this visit.    Allergies:  Patient has no known allergies.    Social History: The patient  reports that he quit smoking about 59 years ago. His smoking use included cigarettes. He started smoking about 62 years ago. He has a 3.00 pack-year smoking history. His smokeless tobacco use includes chew.   ROS:  Please see the history of present illness. Otherwise, complete review of systems is positive for hearing loss.  All other systems are reviewed and negative.   Physical Exam: VS:  BP 132/68   Pulse (!) 59   Ht 5\' 11"  (1.803 m)   Wt 148 lb 3.2 oz (67.2 kg)   SpO2 95%   BMI 20.67 kg/m , BMI Body mass index is 20.67 kg/m.  Wt Readings from Last 3 Encounters:  06/06/18 148 lb 3.2 oz (67.2 kg)  06/04/17 157 lb (71.2 kg)  05/30/16 157 lb (71.2 kg)    General: Elderly male, appears comfortable at rest. HEENT: Conjunctiva and lids normal, oropharynx clear. Neck: Supple, no elevated JVP or carotid bruits, no thyromegaly. Lungs: Clear to auscultation, nonlabored breathing at rest. Cardiac: Regular rate and rhythm, no S3, soft systolic murmur. Abdomen: Soft, nontender,bowel sounds present. Extremities: No pitting edema, distal pulses 2+. Skin: Warm and dry. Musculoskeletal: No kyphosis. Neuropsychiatric: Alert and oriented x3, affect grossly appropriate.  ECG: I personally reviewed the tracing from 06/04/2017 which showed sinus bradycardia.  Recent Labwork:  February 2016: BUN 24, creatinine 1.1, potassium 4.8, AST 14, ALT 13, cholesterol 127, triglycerides 57, HDL 52, LDL 64  Assessment and Plan:  1.  CAD status post angioplasty of the distal LAD in 2004.  He is doing very well in terms of stamina and no angina symptoms on medical therapy.  ECG reviewed and stable.  We have discussed continuing observation without follow-up ischemic testing unless he develops symptomatology.  2.  History of hyperlipidemia.  Requesting lab work from PCP.  He is currently not on statin therapy, has not been on anything for several years.  3.  Essential  hypertension, blood pressure is adequately controlled today.  4.  History of borderline type 2 diabetes mellitus.  Current medicines were reviewed with the patient today.   Orders Placed This Encounter  Procedures  . EKG 12-Lead    Disposition: Follow-up in 1 year.   Signed, Satira Sark, MD, [facility] 06/06/2018 8:54 AM    [facility] [place], [place], [place] [address] Phone: [phone]; Fax: [phone]

## 2018-06-06 ENCOUNTER — Encounter: Payer: Self-pay | Admitting: *Deleted

## 2018-06-06 ENCOUNTER — Ambulatory Visit (INDEPENDENT_AMBULATORY_CARE_PROVIDER_SITE_OTHER): Payer: Medicare Other | Admitting: Cardiology

## 2018-06-06 ENCOUNTER — Encounter: Payer: Self-pay | Admitting: Cardiology

## 2018-06-06 VITALS — BP 132/68 | HR 59 | Ht 71.0 in | Wt 148.2 lb

## 2018-06-06 DIAGNOSIS — Z87898 Personal history of other specified conditions: Secondary | ICD-10-CM | POA: Diagnosis not present

## 2018-06-06 DIAGNOSIS — E782 Mixed hyperlipidemia: Secondary | ICD-10-CM

## 2018-06-06 DIAGNOSIS — I2510 Atherosclerotic heart disease of native coronary artery without angina pectoris: Secondary | ICD-10-CM | POA: Diagnosis not present

## 2018-06-06 DIAGNOSIS — I10 Essential (primary) hypertension: Secondary | ICD-10-CM

## 2018-06-06 MED ORDER — NITROGLYCERIN 0.4 MG SL SUBL: 0.4000 mg | SUBLINGUAL_TABLET | SUBLINGUAL | 3 refills | Status: DC | PRN

## 2018-06-06 NOTE — Patient Instructions (Signed)

## 2018-06-19 ENCOUNTER — Ambulatory Visit (INDEPENDENT_AMBULATORY_CARE_PROVIDER_SITE_OTHER): Payer: Medicare Other | Admitting: Otolaryngology

## 2018-06-19 DIAGNOSIS — H6122 Impacted cerumen, left ear: Secondary | ICD-10-CM | POA: Diagnosis not present

## 2018-06-26 DIAGNOSIS — H35372 Puckering of macula, left eye: Secondary | ICD-10-CM | POA: Diagnosis not present

## 2018-06-26 DIAGNOSIS — H524 Presbyopia: Secondary | ICD-10-CM | POA: Diagnosis not present

## 2018-06-26 DIAGNOSIS — Z961 Presence of intraocular lens: Secondary | ICD-10-CM | POA: Diagnosis not present

## 2018-06-26 DIAGNOSIS — H31011 Macula scars of posterior pole (postinflammatory) (post-traumatic), right eye: Secondary | ICD-10-CM | POA: Diagnosis not present

## 2018-08-01 DIAGNOSIS — H31011 Macula scars of posterior pole (postinflammatory) (post-traumatic), right eye: Secondary | ICD-10-CM | POA: Diagnosis not present

## 2018-08-01 DIAGNOSIS — Z961 Presence of intraocular lens: Secondary | ICD-10-CM | POA: Diagnosis not present

## 2018-08-01 DIAGNOSIS — H35342 Macular cyst, hole, or pseudohole, left eye: Secondary | ICD-10-CM | POA: Diagnosis not present

## 2018-08-01 DIAGNOSIS — H43822 Vitreomacular adhesion, left eye: Secondary | ICD-10-CM | POA: Diagnosis not present

## 2018-08-07 DIAGNOSIS — C61 Malignant neoplasm of prostate: Secondary | ICD-10-CM | POA: Diagnosis not present

## 2018-08-14 DIAGNOSIS — N401 Enlarged prostate with lower urinary tract symptoms: Secondary | ICD-10-CM | POA: Diagnosis not present

## 2018-08-14 DIAGNOSIS — R3912 Poor urinary stream: Secondary | ICD-10-CM | POA: Diagnosis not present

## 2018-08-14 DIAGNOSIS — C61 Malignant neoplasm of prostate: Secondary | ICD-10-CM | POA: Diagnosis not present

## 2018-08-14 DIAGNOSIS — R351 Nocturia: Secondary | ICD-10-CM | POA: Diagnosis not present

## 2018-12-09 DIAGNOSIS — E7800 Pure hypercholesterolemia, unspecified: Secondary | ICD-10-CM | POA: Diagnosis not present

## 2018-12-09 DIAGNOSIS — I2510 Atherosclerotic heart disease of native coronary artery without angina pectoris: Secondary | ICD-10-CM | POA: Diagnosis not present

## 2018-12-09 DIAGNOSIS — I10 Essential (primary) hypertension: Secondary | ICD-10-CM | POA: Diagnosis not present

## 2018-12-09 DIAGNOSIS — C61 Malignant neoplasm of prostate: Secondary | ICD-10-CM | POA: Diagnosis not present

## 2018-12-09 DIAGNOSIS — Z0000 Encounter for general adult medical examination without abnormal findings: Secondary | ICD-10-CM | POA: Diagnosis not present

## 2019-01-19 DIAGNOSIS — C61 Malignant neoplasm of prostate: Secondary | ICD-10-CM | POA: Diagnosis not present

## 2019-01-23 DIAGNOSIS — R351 Nocturia: Secondary | ICD-10-CM | POA: Diagnosis not present

## 2019-01-23 DIAGNOSIS — C61 Malignant neoplasm of prostate: Secondary | ICD-10-CM | POA: Diagnosis not present

## 2019-02-19 ENCOUNTER — Ambulatory Visit (INDEPENDENT_AMBULATORY_CARE_PROVIDER_SITE_OTHER): Payer: Medicare Other | Admitting: Otolaryngology

## 2019-03-16 ENCOUNTER — Ambulatory Visit (INDEPENDENT_AMBULATORY_CARE_PROVIDER_SITE_OTHER): Payer: Medicare Other | Admitting: Otolaryngology

## 2019-03-16 DIAGNOSIS — H6123 Impacted cerumen, bilateral: Secondary | ICD-10-CM | POA: Diagnosis not present

## 2019-05-21 ENCOUNTER — Telehealth: Payer: Self-pay | Admitting: Cardiology

## 2019-05-21 NOTE — Telephone Encounter (Signed)
Virtual Visit Pre-Appointment Phone Call  "(Name), I am calling you today to discuss your upcoming appointment. We are currently trying to limit exposure to the virus that causes COVID-19 by seeing patients at home rather than in the office."  1. "What is the BEST phone number to call the day of the visit?" - include this in appointment notes  2. Do you have or have access to (through a family member/friend) a smartphone with video capability that we can use for your visit?" a. If yes - list this number in appt notes as cell (if different from BEST phone #) and list the appointment type as a VIDEO visit in appointment notes b. If no - list the appointment type as a PHONE visit in appointment notes  3. Confirm consent - "In the setting of the current Covid19 crisis, you are scheduled for a (phone or video) visit with your provider on (date) at (time).  Just as we do with many in-office visits, in order for you to participate in this visit, we must obtain consent.  If you'd like, I can send this to your mychart (if signed up) or email for you to review.  Otherwise, I can obtain your verbal consent now.  All virtual visits are billed to your insurance company just like a normal visit would be.  By agreeing to a virtual visit, we'd like you to understand that the technology does not allow for your provider to perform an examination, and thus may limit your provider's ability to fully assess your condition. If your provider identifies any concerns that need to be evaluated in person, we will make arrangements to do so.  Finally, though the technology is pretty good, we cannot assure that it will always work on either your or our end, and in the setting of a video visit, we may have to convert it to a phone-only visit.  In either situation, we cannot ensure that we have a secure connection.  Are you willing to proceed?" STAFF: Did the patient verbally acknowledge consent to telehealth visit? Document  YES/NO here: yes  4. Advise patient to be prepared - "Two hours prior to your appointment, go ahead and check your blood pressure, pulse, oxygen saturation, and your weight (if you have the equipment to check those) and write them all down. When your visit starts, your provider will ask you for this information. If you have an Apple Watch or Kardia device, please plan to have heart rate information ready on the day of your appointment. Please have a pen and paper handy nearby the day of the visit as well."  5. Give patient instructions for MyChart download to smartphone OR Doximity/Doxy.me as below if video visit (depending on what platform provider is using)  6. Inform patient they will receive a phone call 15 minutes prior to their appointment time (may be from unknown caller ID) so they should be prepared to answer    TELEPHONE CALL NOTE  Randy Moyer has been deemed a candidate for a follow-up tele-health visit to limit community exposure during the Covid-19 pandemic. I spoke with the patient via phone to ensure availability of phone/video source, confirm preferred email & phone number, and discuss instructions and expectations.  I reminded Randy Moyer to be prepared with any vital sign and/or heart rhythm information that could potentially be obtained via home monitoring, at the time of his visit. I reminded Randy Moyer to expect a phone call prior to  his visit.  Randy Moyer 05/21/2019 4:37 PM   INSTRUCTIONS FOR DOWNLOADING THE MYCHART APP TO SMARTPHONE  - The patient must first make sure to have activated MyChart and know their login information - If Apple, go to [employer] and type in MyChart in the search bar and download the app. If Android, ask patient to go to [employer] and type in [place] in the search bar and download the app. The app is free but as with any other app downloads, their phone may require them to verify saved payment information or Apple/Android  password.  - The patient will need to then log into the app with their MyChart username and password, and select [facility] as their healthcare provider to link the account. When it is time for your visit, go to the MyChart app, find appointments, and click Begin Video Visit. Be sure to Select Allow for your device to access the Microphone and Camera for your visit. You will then be connected, and your provider will be with you shortly.  **If they have any issues connecting, or need assistance please contact MyChart service desk [phone])**  **If using a computer, in order to ensure the best quality for their visit they will need to use either of the following Internet Browsers: [employer], or Google Chrome**  IF USING DOXIMITY or DOXY.ME - The patient will receive a link just prior to their visit by text.     FULL LENGTH CONSENT FOR TELE-HEALTH VISIT   I hereby voluntarily request, consent and authorize [facility] and its employed or contracted physicians, physician assistants, nurse practitioners or other licensed health care professionals (the Practitioner), to provide me with telemedicine health care services (the Services") as deemed necessary by the treating Practitioner. I acknowledge and consent to receive the Services by the Practitioner via telemedicine. I understand that the telemedicine visit will involve communicating with the Practitioner through live audiovisual communication technology and the disclosure of certain medical information by electronic transmission. I acknowledge that I have been given the opportunity to request an in-person assessment or other available alternative prior to the telemedicine visit and am voluntarily participating in the telemedicine visit.  I understand that I have the right to withhold or withdraw my consent to the use of telemedicine in the course of my care at any time, without affecting my right to future care or treatment,  and that the Practitioner or I may terminate the telemedicine visit at any time. I understand that I have the right to inspect all information obtained and/or recorded in the course of the telemedicine visit and may receive copies of available information for a reasonable fee.  I understand that some of the potential risks of receiving the Services via telemedicine include:   Delay or interruption in medical evaluation due to technological equipment failure or disruption;  Information transmitted may not be sufficient (e.g. poor resolution of images) to allow for appropriate medical decision making by the Practitioner; and/or   In rare instances, security protocols could fail, causing a breach of personal health information.  Furthermore, I acknowledge that it is my responsibility to provide information about my medical history, conditions and care that is complete and accurate to the best of my ability. I acknowledge that Practitioner's advice, recommendations, and/or decision may be based on factors not within their control, such as incomplete or inaccurate data provided by me or distortions of diagnostic images or specimens that may result from electronic transmissions. I  understand that the practice of medicine is not an exact science and that Practitioner makes no warranties or guarantees regarding treatment outcomes. I acknowledge that I will receive a copy of this consent concurrently upon execution via email to the email address I last provided but may also request a printed copy by calling the office of [facility].    I understand that my insurance will be billed for this visit.   I have read or had this consent read to me.  I understand the contents of this consent, which adequately explains the benefits and risks of the Services being provided via telemedicine.   I have been provided ample opportunity to ask questions regarding this consent and the Services and have had my questions  answered to my satisfaction.  I give my informed consent for the services to be provided through the use of telemedicine in my medical care  By participating in this telemedicine visit I agree to the above.

## 2019-05-31 NOTE — Progress Notes (Signed)
Virtual Visit via Telephone Note   This visit type was conducted due to national recommendations for restrictions regarding the COVID-19 Pandemic (e.g. social distancing) in an effort to limit this patient's exposure and mitigate transmission in our community.  Due to his co-morbid illnesses, this patient is at least at moderate risk for complications without adequate follow up.  This format is felt to be most appropriate for this patient at this time.  The patient did not have access to video technology/had technical difficulties with video requiring transitioning to audio format only (telephone).  All issues noted in this document were discussed and addressed.  No physical exam could be performed with this format.  Please refer to the patient's chart for his  consent to telehealth for [facility].   Date:  06/01/2019   ID:  Randy Moyer, DOB [birth year], MRN [id number]  Patient Location: Home Provider Location: Office  PCP:  Lawerance Cruel, MD  Cardiologist:  Rozann Lesches, MD Electrophysiologist:  None   Evaluation Performed:  Follow-Up Visit  Chief Complaint:  Cardiac follow-up  History of Present Illness:    Randy Moyer is a 85 y.o. male last seen in November 2019.  We spoke by phone today.  He tells me that he has been doing well.  He and his wife have been staying at home mostly, wear masks when they go out and have otherwise been social distancing.  He remains functional with ADLs including yard work, recently outdoors picking up limbs after the storm.  He does not report any angina symptoms or nitroglycerin use.  Stable NYHA class II dyspnea.  I reviewed his medications which are listed below.  He reports no intolerances.  Recent lipid numbers are reviewed below as well.  He continues to follow regularly with Dr. Harrington Challenger.  The patient does not have symptoms concerning for COVID-19 infection (fever, chills, cough, or new shortness of breath).    Past Medical History:   Diagnosis Date  . Borderline diabetes mellitus   . CAD (coronary artery disease)    Cutting balloon angioplasty to distal LAD 2004  . Essential hypertension   . Hyperlipidemia    Past Surgical History:  Procedure Laterality Date  . CORONARY ANGIOPLASTY WITH STENT PLACEMENT    . HERNIA REPAIR     in the 1950's  . TONSILLECTOMY     age 33 or 43     Current Meds  Medication Sig  . aspirin (ASPIR-LOW) 81 MG EC tablet Take 81 mg by mouth daily.    . metoprolol succinate (TOPROL-XL) 100 MG 24 hr tablet TAKE 1 TABLET BY MOUTH ONCE A DAY  . nitroGLYCERIN (NITROSTAT) 0.4 MG SL tablet Place 1 tablet (0.4 mg total) under the tongue every 5 (five) minutes x 3 doses as needed for chest pain (If no relief after 3rd dose, proceed to the ED for an evaluation).  . ramipril (ALTACE) 2.5 MG capsule Take 2.5 mg by mouth daily.       Allergies:   Patient has no known allergies.   Social History   Tobacco Use  . Smoking status: Former Smoker    Packs/day: 0.50    Years: 6.00    Pack years: 3.00    Types: Cigarettes    Start date: 11/21/1955    Quit date: 07/16/1958    Years since quitting: 60.9  . Smokeless tobacco: Current User    Types: Chew  . Tobacco comment: chews 1 pack tobacco per day  Substance  Use Topics  . Alcohol use: Not on file  . Drug use: Not on file     Family Hx: The patient's family history includes Coronary artery disease in an other family member.  ROS:   Please see the history of present illness.    Hearing loss, uses hearing aids. All other systems reviewed and are negative.   Prior CV studies:   The following studies were reviewed today:  No interval cardiac testing for review.  Labs/Other Tests and Data Reviewed:    EKG:  An ECG dated 06/06/2018 was personally reviewed today and demonstrated:  Sinus bradycardia.  Recent Labs:  2020: Total cholesterol 123, LDL 59, HDL 70, triglycerides 57  Wt Readings from Last 3 Encounters:  06/01/19 147 lb 8 oz  (66.9 kg)  06/06/18 148 lb 3.2 oz (67.2 kg)  06/04/17 157 lb (71.2 kg)     Objective:    Vital Signs:  Ht 5\' 11"  (1.803 m)   Wt 147 lb 8 oz (66.9 kg)   BMI 20.57 kg/m   Patient spoke in full sentences, not short of breath. No audible wheezing or coughing. Speech pattern normal.  ASSESSMENT & PLAN:    1.  CAD status post angioplasty of the distal LAD in 2004.  We are managing him medically and he does not report any active angina symptoms or nitroglycerin use.  Continue aspirin, beta-blocker, and ACE inhibitor.  He is currently not on statin with prior history of intolerance.  2.  History of hyperlipidemia.  Lipid numbers actually look fairly good in the absence of active treatment, LDL 59 and HDL 70.  3.  Essential hypertension, no changes made to present regimen.  Keep follow-up with PCP.  4.  History of borderline type 2 diabetes mellitus, keep follow-up with PCP.  This is managed via diet.  COVID-19 Education: The signs and symptoms of COVID-19 were discussed with the patient and how to seek care for testing (follow up with PCP or arrange E-visit).  The importance of social distancing was discussed today.  Time:   Today, I have spent 6 minutes with the patient with telehealth technology discussing the above problems.     Medication Adjustments/Labs and Tests Ordered: Current medicines are reviewed at length with the patient today.  Concerns regarding medicines are outlined above.   Tests Ordered: No orders of the defined types were placed in this encounter.   Medication Changes:  No orders of the defined types were placed in this encounter.   Follow Up:  In Person 1 year in the [place] office.  Signed, Rozann Lesches, MD  06/01/2019 8:44 AM    [name]

## 2019-06-01 ENCOUNTER — Encounter: Payer: Self-pay | Admitting: Cardiology

## 2019-06-01 ENCOUNTER — Telehealth (INDEPENDENT_AMBULATORY_CARE_PROVIDER_SITE_OTHER): Payer: Medicare Other | Admitting: Cardiology

## 2019-06-01 VITALS — Ht 71.0 in | Wt 147.5 lb

## 2019-06-01 DIAGNOSIS — E782 Mixed hyperlipidemia: Secondary | ICD-10-CM | POA: Diagnosis not present

## 2019-06-01 DIAGNOSIS — I10 Essential (primary) hypertension: Secondary | ICD-10-CM

## 2019-06-01 DIAGNOSIS — I2510 Atherosclerotic heart disease of native coronary artery without angina pectoris: Secondary | ICD-10-CM | POA: Diagnosis not present

## 2019-06-01 NOTE — Patient Instructions (Addendum)

## 2019-12-09 DIAGNOSIS — H35372 Puckering of macula, left eye: Secondary | ICD-10-CM | POA: Diagnosis not present

## 2019-12-09 DIAGNOSIS — Z961 Presence of intraocular lens: Secondary | ICD-10-CM | POA: Diagnosis not present

## 2019-12-09 DIAGNOSIS — H35341 Macular cyst, hole, or pseudohole, right eye: Secondary | ICD-10-CM | POA: Diagnosis not present

## 2019-12-09 DIAGNOSIS — H524 Presbyopia: Secondary | ICD-10-CM | POA: Diagnosis not present

## 2019-12-10 DIAGNOSIS — I2510 Atherosclerotic heart disease of native coronary artery without angina pectoris: Secondary | ICD-10-CM | POA: Diagnosis not present

## 2019-12-10 DIAGNOSIS — Z0000 Encounter for general adult medical examination without abnormal findings: Secondary | ICD-10-CM | POA: Diagnosis not present

## 2019-12-10 DIAGNOSIS — E7800 Pure hypercholesterolemia, unspecified: Secondary | ICD-10-CM | POA: Diagnosis not present

## 2019-12-10 DIAGNOSIS — C61 Malignant neoplasm of prostate: Secondary | ICD-10-CM | POA: Diagnosis not present

## 2019-12-10 DIAGNOSIS — I10 Essential (primary) hypertension: Secondary | ICD-10-CM | POA: Diagnosis not present

## 2019-12-10 DIAGNOSIS — R233 Spontaneous ecchymoses: Secondary | ICD-10-CM | POA: Diagnosis not present

## 2020-02-04 DIAGNOSIS — C61 Malignant neoplasm of prostate: Secondary | ICD-10-CM | POA: Diagnosis not present

## 2020-02-09 DIAGNOSIS — C61 Malignant neoplasm of prostate: Secondary | ICD-10-CM | POA: Diagnosis not present

## 2020-02-23 ENCOUNTER — Other Ambulatory Visit (HOSPITAL_COMMUNITY): Payer: Self-pay | Admitting: Urology

## 2020-02-23 DIAGNOSIS — R9721 Rising PSA following treatment for malignant neoplasm of prostate: Secondary | ICD-10-CM

## 2020-02-23 DIAGNOSIS — C61 Malignant neoplasm of prostate: Secondary | ICD-10-CM

## 2020-03-02 ENCOUNTER — Ambulatory Visit (HOSPITAL_COMMUNITY)
Admission: RE | Admit: 2020-03-02 | Discharge: 2020-03-02 | Disposition: A | Discharge status: Home / Self Care | Payer: Medicare Other | Source: Ambulatory Visit | Attending: Urology | Admitting: Urology

## 2020-03-02 ENCOUNTER — Other Ambulatory Visit: Payer: Self-pay

## 2020-03-02 DIAGNOSIS — R9721 Rising PSA following treatment for malignant neoplasm of prostate: Secondary | ICD-10-CM | POA: Insufficient documentation

## 2020-03-02 DIAGNOSIS — C61 Malignant neoplasm of prostate: Secondary | ICD-10-CM | POA: Insufficient documentation

## 2020-03-02 MED ORDER — AXUMIN (FLUCICLOVINE F 18) INJECTION
9.9000 | Freq: Once | INTRAVENOUS | Status: AC | PRN
  Administered 2020-03-02: 9.9 via INTRAVENOUS

## 2020-03-10 DIAGNOSIS — C61 Malignant neoplasm of prostate: Secondary | ICD-10-CM | POA: Diagnosis not present

## 2020-03-11 ENCOUNTER — Telehealth: Payer: Self-pay | Admitting: Cardiology

## 2020-03-11 NOTE — Telephone Encounter (Signed)
Wife Letta Median) notified & verbalized understanding.  1 year OV scheduled for 05/31/2020 with Dr. Domenic Polite.

## 2020-03-11 NOTE — Telephone Encounter (Signed)
Left message to return call 

## 2020-03-11 NOTE — Telephone Encounter (Signed)
Noted.  I took a look at the recent report.  There is incidental mention of aortic atherosclerosis and also valvular calcification, both of which I would expect at his age and with known history of CAD.  Not entirely clear that either of these findings are symptomatic.  Keep scheduled office follow-up, I can listen for any progressive heart murmurs or signs that we ought to consider an echocardiogram, otherwise would continue medications for now.

## 2020-03-11 NOTE — Telephone Encounter (Signed)
Will forward to provider for review.  PET scan dated 03/02/2020 in [place].

## 2020-03-11 NOTE — Telephone Encounter (Signed)
Please give pt- a call concerning findings from PET scan - pt wanted Dr. Domenic Polite to be aware

## 2020-05-11 ENCOUNTER — Encounter: Payer: Self-pay | Admitting: *Deleted

## 2020-05-11 ENCOUNTER — Encounter: Payer: Self-pay | Admitting: Cardiology

## 2020-05-11 ENCOUNTER — Ambulatory Visit (INDEPENDENT_AMBULATORY_CARE_PROVIDER_SITE_OTHER): Payer: Medicare Other | Admitting: Cardiology

## 2020-05-11 VITALS — BP 118/60 | HR 57 | Ht 70.0 in | Wt 139.0 lb

## 2020-05-11 DIAGNOSIS — I10 Essential (primary) hypertension: Secondary | ICD-10-CM

## 2020-05-11 DIAGNOSIS — I25119 Atherosclerotic heart disease of native coronary artery with unspecified angina pectoris: Secondary | ICD-10-CM | POA: Diagnosis not present

## 2020-05-11 MED ORDER — NITROGLYCERIN 0.4 MG SL SUBL: 0.4000 mg | SUBLINGUAL_TABLET | SUBLINGUAL | 3 refills | Status: DC | PRN

## 2020-05-11 NOTE — Progress Notes (Signed)
Cardiology Office Note  Date: 05/11/2020   ID: Randy Moyer, DOB 1931-02-07, MRN [id number]  PCP:  Lawerance Cruel, MD  Cardiologist:  Rozann Lesches, MD Electrophysiologist:  None   Chief Complaint  Patient presents with  . Cardiac follow-up    History of Present Illness: Randy Moyer is an 86 y.o. adult last assessed via telehealth encounter in November 2020.  He presents for a routine visit.  Reports no significant angina or nitroglycerin use.  He remains active with chores, has recently been doing some updating on a house he owns down in [place].  I reviewed his medications which are outlined below.  We plan to get his most recent lab work from PCP.  I personally reviewed his ECG today which shows sinus bradycardia.  Past Medical History:  Diagnosis Date  . Borderline diabetes mellitus   . CAD (coronary artery disease)    Cutting balloon angioplasty to distal LAD 2004  . Essential hypertension   . Hyperlipidemia     Past Surgical History:  Procedure Laterality Date  . CORONARY ANGIOPLASTY WITH STENT PLACEMENT    . HERNIA REPAIR     in the 1950's  . TONSILLECTOMY     age 11 or 83    Current Outpatient Medications  Medication Sig Dispense Refill  . aspirin (ASPIR-LOW) 81 MG EC tablet Take 81 mg by mouth daily.      . metoprolol succinate (TOPROL-XL) 100 MG 24 hr tablet TAKE 1 TABLET BY MOUTH ONCE A DAY 90 tablet 2  . nitroGLYCERIN (NITROSTAT) 0.4 MG SL tablet Place 1 tablet (0.4 mg total) under the tongue every 5 (five) minutes x 3 doses as needed for chest pain (If no relief after 2nd dose, proceed to the ED for an evaluation). 25 tablet 3  . ramipril (ALTACE) 2.5 MG capsule Take 2.5 mg by mouth daily.       No current facility-administered medications for this visit.   Allergies:  Patient has no known allergies.   ROS: No palpitations or syncope.  Physical Exam: VS:  BP 118/60   Pulse (!) 57   Ht 5\' 10"  (1.778 m)   Wt 139 lb (63 kg)   SpO2 98%    BMI 19.94 kg/m , BMI Body mass index is 19.94 kg/m.  Wt Readings from Last 3 Encounters:  05/11/20 139 lb (63 kg)  06/01/19 147 lb 8 oz (66.9 kg)  06/06/18 148 lb 3.2 oz (67.2 kg)    General: Elderly male, appears comfortable at rest. HEENT: Conjunctiva and lids normal, wearing a mask. Neck: Supple, no elevated JVP or carotid bruits, no thyromegaly. Lungs: Clear to auscultation, nonlabored breathing at rest. Cardiac: Regular rate and rhythm, no S3 or significant systolic murmur, no pericardial rub. Extremities: No pitting edema, distal pulses 2+.  ECG:  An ECG dated 06/06/2018 was personally reviewed today and demonstrated:  Sinus bradycardia.  Recent Labwork:  2020: Total cholesterol 123, LDL 59, HDL 70, triglycerides 57  Other Studies Reviewed Today:  No interval cardiac testing for review today.  Assessment and Plan:  1.  CAD status post angioplasty of the distal LAD in 2004.  He has done remarkably well over time, no active angina on medical therapy and with good functional capacity.  ECG reviewed and stable.  Continue aspirin, Toprol-XL, Altase, and as needed nitroglycerin.  He is not on statin therapy, his last LDL was 59.  2.  Essential hypertension, blood pressure is normal today.  Continue  Altace and Toprol-XL.  Medication Adjustments/Labs and Tests Ordered: Current medicines are reviewed at length with the patient today.  Concerns regarding medicines are outlined above.   Tests Ordered: Orders Placed This Encounter  Procedures  . EKG 12-Lead    Medication Changes: Meds ordered this encounter  Medications  . nitroGLYCERIN (NITROSTAT) 0.4 MG SL tablet    Sig: Place 1 tablet (0.4 mg total) under the tongue every 5 (five) minutes x 3 doses as needed for chest pain (If no relief after 2nd dose, proceed to the ED for an evaluation).    Dispense:  25 tablet    Refill:  3    Disposition:  Follow up 1 year in the [place] office.  Signed, Satira Sark, MD,  [facility] 05/11/2020 1:49 PM    [facility] [place], [place], [place] [address] Phone: [phone]; Fax: [phone]

## 2020-05-11 NOTE — Patient Instructions (Signed)

## 2020-05-31 ENCOUNTER — Ambulatory Visit: Payer: Medicare Other | Admitting: Cardiology

## 2020-10-28 DIAGNOSIS — R059 Cough, unspecified: Secondary | ICD-10-CM | POA: Diagnosis not present

## 2020-11-07 DIAGNOSIS — R059 Cough, unspecified: Secondary | ICD-10-CM | POA: Diagnosis not present

## 2020-12-22 DIAGNOSIS — Z0000 Encounter for general adult medical examination without abnormal findings: Secondary | ICD-10-CM | POA: Diagnosis not present

## 2020-12-22 DIAGNOSIS — E7800 Pure hypercholesterolemia, unspecified: Secondary | ICD-10-CM | POA: Diagnosis not present

## 2020-12-22 DIAGNOSIS — I10 Essential (primary) hypertension: Secondary | ICD-10-CM | POA: Diagnosis not present

## 2020-12-22 DIAGNOSIS — I2510 Atherosclerotic heart disease of native coronary artery without angina pectoris: Secondary | ICD-10-CM | POA: Diagnosis not present

## 2020-12-22 DIAGNOSIS — R233 Spontaneous ecchymoses: Secondary | ICD-10-CM | POA: Diagnosis not present

## 2021-02-14 DIAGNOSIS — C61 Malignant neoplasm of prostate: Secondary | ICD-10-CM | POA: Diagnosis not present

## 2021-02-21 DIAGNOSIS — C61 Malignant neoplasm of prostate: Secondary | ICD-10-CM | POA: Diagnosis not present

## 2021-03-01 ENCOUNTER — Other Ambulatory Visit (HOSPITAL_COMMUNITY): Payer: Self-pay | Admitting: Nurse Practitioner

## 2021-03-01 DIAGNOSIS — R9721 Rising PSA following treatment for malignant neoplasm of prostate: Secondary | ICD-10-CM

## 2021-03-01 DIAGNOSIS — C61 Malignant neoplasm of prostate: Secondary | ICD-10-CM

## 2021-03-14 ENCOUNTER — Ambulatory Visit (HOSPITAL_COMMUNITY)
Admission: RE | Admit: 2021-03-14 | Discharge: 2021-03-14 | Disposition: A | Discharge status: Home / Self Care | Payer: Medicare Other | Source: Ambulatory Visit | Attending: Nurse Practitioner | Admitting: Nurse Practitioner

## 2021-03-14 ENCOUNTER — Other Ambulatory Visit: Payer: Self-pay

## 2021-03-14 DIAGNOSIS — J929 Pleural plaque without asbestos: Secondary | ICD-10-CM | POA: Diagnosis not present

## 2021-03-14 DIAGNOSIS — I700 Atherosclerosis of aorta: Secondary | ICD-10-CM | POA: Diagnosis not present

## 2021-03-14 DIAGNOSIS — C61 Malignant neoplasm of prostate: Secondary | ICD-10-CM | POA: Insufficient documentation

## 2021-03-14 DIAGNOSIS — R9721 Rising PSA following treatment for malignant neoplasm of prostate: Secondary | ICD-10-CM | POA: Diagnosis not present

## 2021-03-14 MED ORDER — PIFLIFOLASTAT F 18 (PYLARIFY) INJECTION
9.0000 | Freq: Once | INTRAVENOUS | Status: AC
  Administered 2021-03-14: 8.94 via INTRAVENOUS

## 2021-05-11 DIAGNOSIS — U071 COVID-19: Secondary | ICD-10-CM | POA: Diagnosis not present

## 2021-05-11 DIAGNOSIS — Z23 Encounter for immunization: Secondary | ICD-10-CM | POA: Diagnosis not present

## 2021-06-01 DIAGNOSIS — M79672 Pain in left foot: Secondary | ICD-10-CM | POA: Diagnosis not present

## 2021-06-01 DIAGNOSIS — L110 Acquired keratosis follicularis: Secondary | ICD-10-CM | POA: Diagnosis not present

## 2021-06-01 DIAGNOSIS — I739 Peripheral vascular disease, unspecified: Secondary | ICD-10-CM | POA: Diagnosis not present

## 2021-06-01 DIAGNOSIS — L600 Ingrowing nail: Secondary | ICD-10-CM | POA: Diagnosis not present

## 2021-06-01 DIAGNOSIS — M79671 Pain in right foot: Secondary | ICD-10-CM | POA: Diagnosis not present

## 2021-09-02 IMAGING — PT NM PET TUM IMG SKULL BASE T - THIGH
7 series · 25 of 25 positions shown · non-contrast
Comparison: 03/02/2020 (Axumin PET scan)

CLINICAL DATA: Prostate carcinoma with biochemical recurrence.

EXAM:
NUCLEAR MEDICINE PET SKULL BASE TO THIGH
TECHNIQUE: 8.94 mCi F18 Piflufolastat (Pylarify) was injected intravenously.
Full-ring PET imaging was performed from the skull base to thigh
after the radiotracer. CT data was obtained and used for attenuation
correction and anatomic localization.

[Series 3: pet sk_thigh ac · axial · 5.0mm · 4.07mm/px · z∈[-528,+480]mm · 6 of 253 slices shown]
[im 1/253]
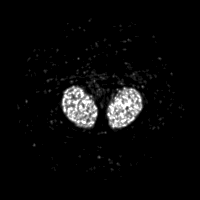
[im 51/253]
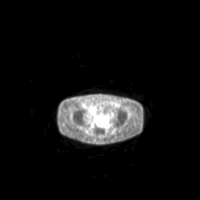
[im 101/253]
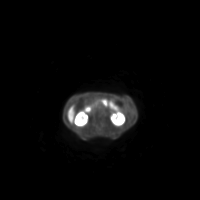
[im 152/253]
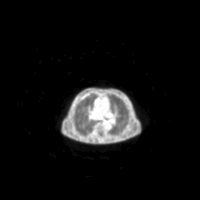
[im 202/253]
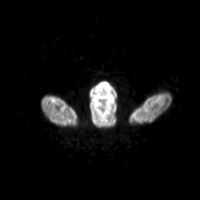
[im 253/253]
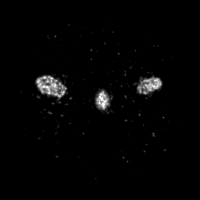

[Series 4: ct sk_thigh 5.0 bf37 · axial · 5.0mm · 0.98mm/px · z∈[-528,+480]mm · 5 of 253 slices shown]
[im 1/253]
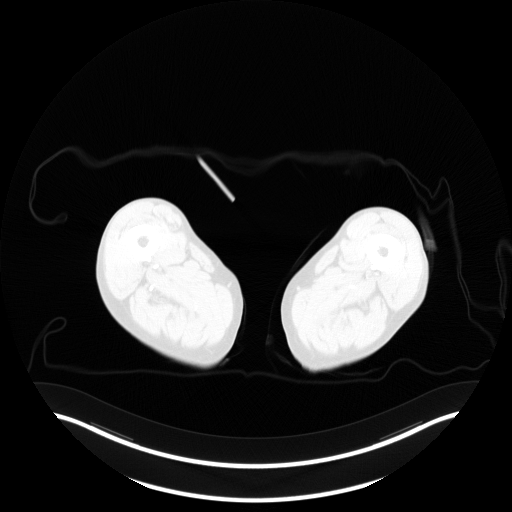
[im 64/253]
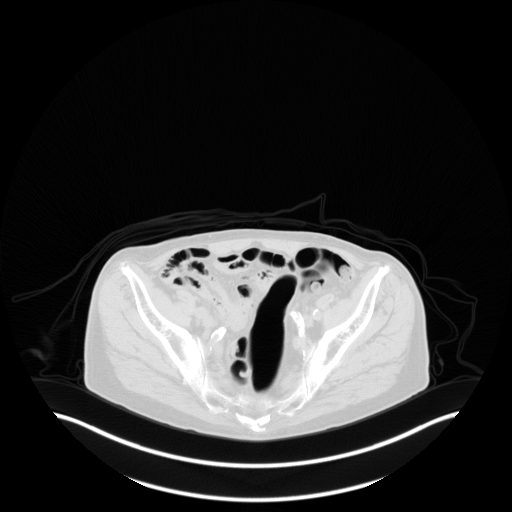
[im 127/253]
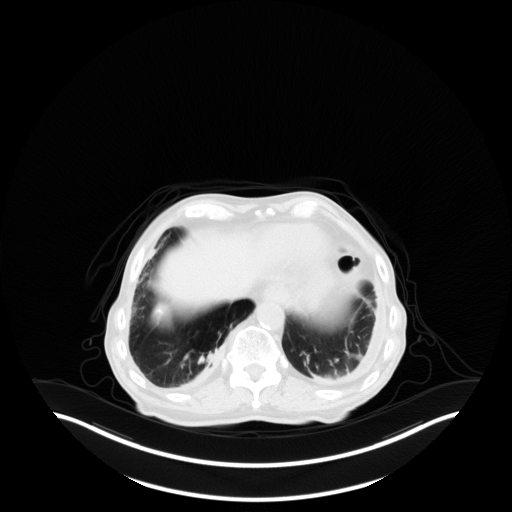
[im 190/253]
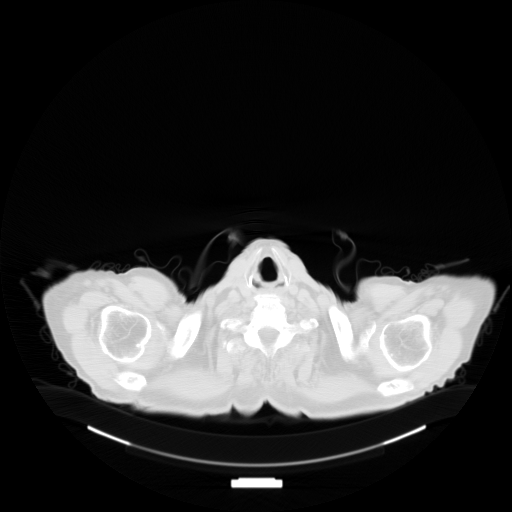
[im 253/253]
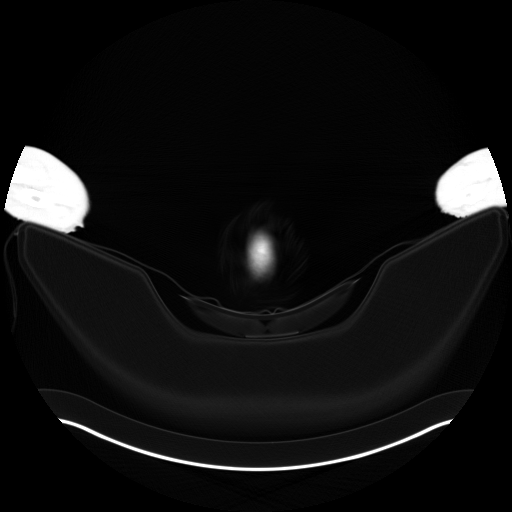

[Series 5: pet sk_thigh nac · axial · 5.0mm · 4.07mm/px · z∈[-528,+480]mm · 5 of 253 slices shown]
[im 1/253]
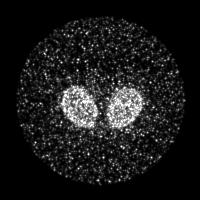
[im 64/253]
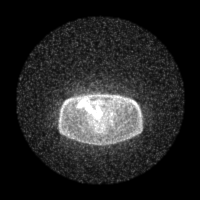
[im 127/253]
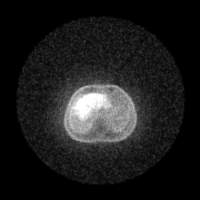
[im 190/253]
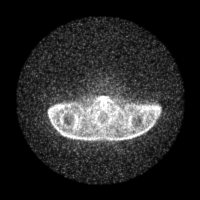
[im 253/253]
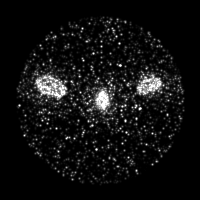

[Series 8: ct sk_thigh 5.0 br59 lung_bone · axial · 5.0mm · 0.63mm/px · z∈[-84,+232]mm · 2 of 80 slices shown]
[im 1/80]
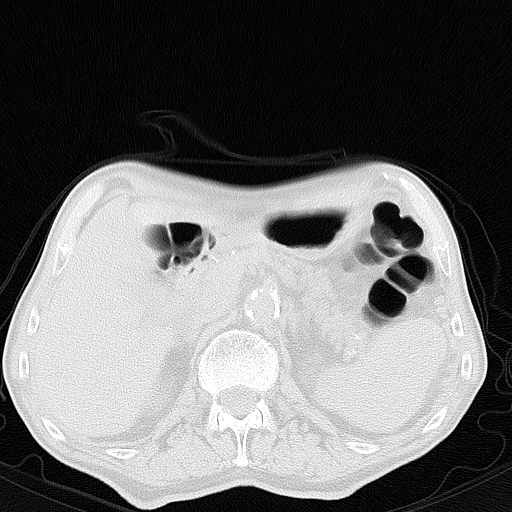
[im 80/80  brain]
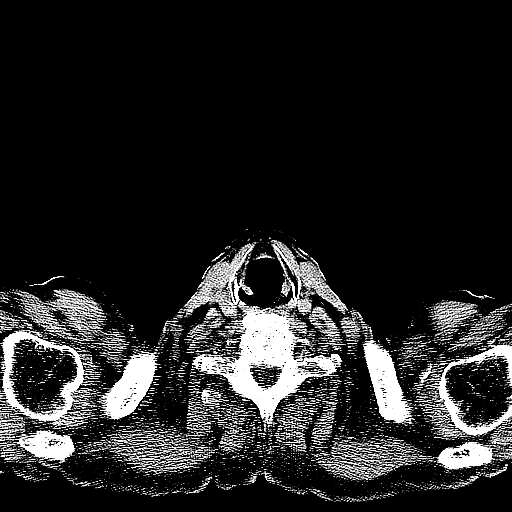

[Series 603: fused cor · 1 of 39 slices shown]
[im 1/39]
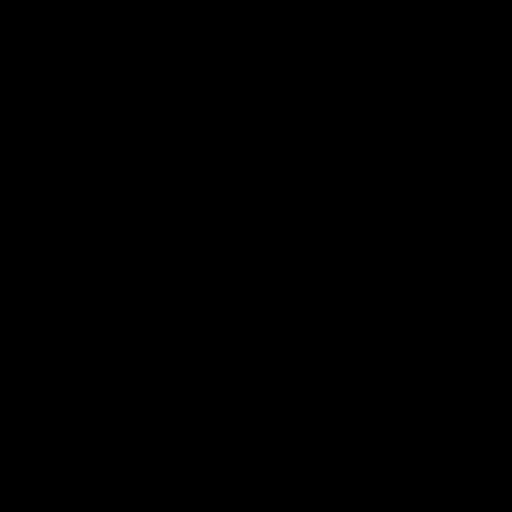

[Series 604: <mip collection> · coronal · 2.09mm/px · 1 of 32 slices shown]
[im 1/32]
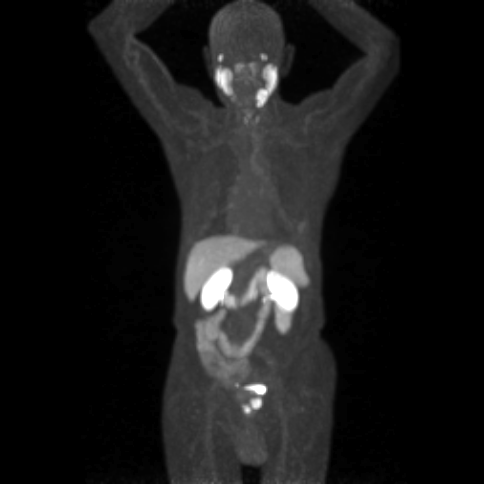

[Series 605: range-ct sk_thigh 5.0 bf37-tra-<alpha range> · 5 of 247 slices shown]
[im 1/247]
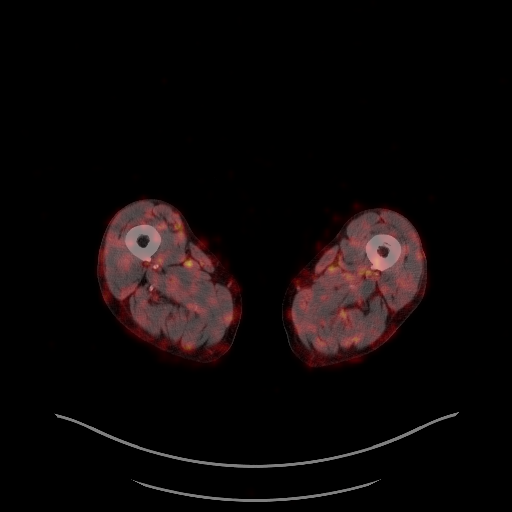
[im 62/247]
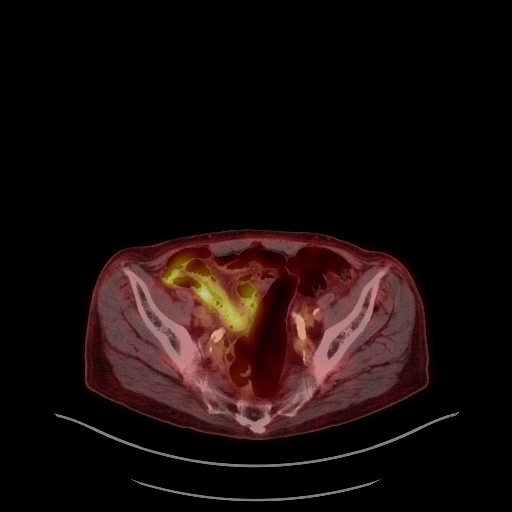
[im 124/247]
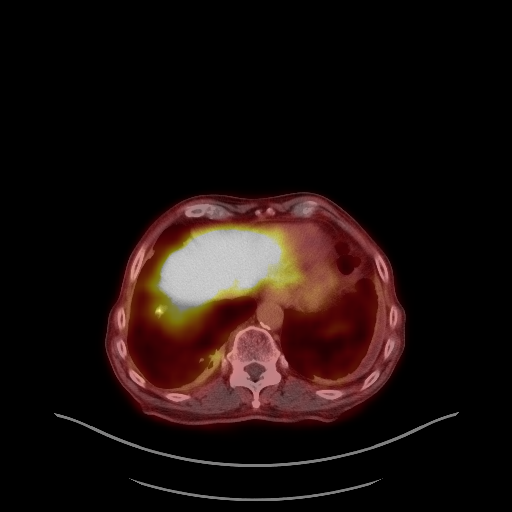
[im 185/247]
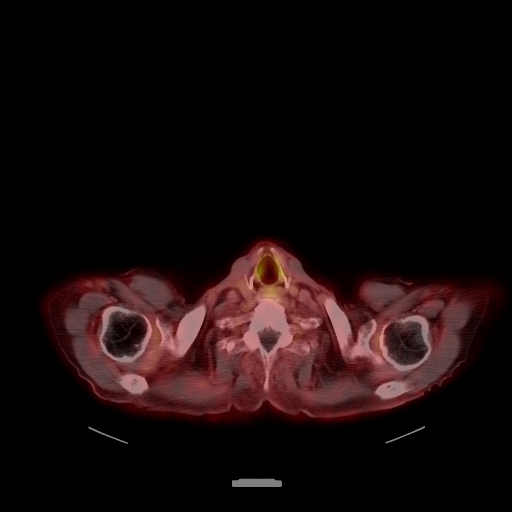
[im 247/247]
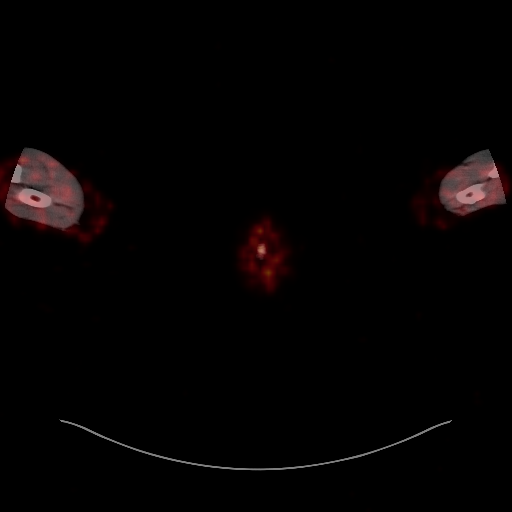

[25 of 25 positions shown; findings below may reference images not displayed]

FINDINGS: NECK

No radiotracer activity in neck lymph nodes.

Incidental CT finding: None

CHEST

No radiotracer accumulation within mediastinal or hilar lymph nodes.
No suspicious pulmonary nodules on the CT scan.

Incidental CT finding: Calcified pleural plaques at the lung bases

ABDOMEN/PELVIS

Prostate: Intense activity localizing to the LEFT lobe of the
prostate gland posteriorly with SUV max equal 22.8. (Image 213)

Lymph nodes: No abnormal radiotracer accumulation within pelvic or
abdominal nodes.

Liver: No evidence of liver metastasis

Incidental CT finding: Atherosclerotic calcification of the aorta.

SKELETON

No focal  activity to suggest skeletal metastasis.
IMPRESSION: 1. Intense activity within the LEFT prostate gland consistent with
residual/recurrent prostate carcinoma.
2. No evidence of metastatic adenopathy within the pelvis or
periaortic retroperitoneum.
3. No evidence of visceral metastasis or skeletal metastasis.

## 2021-10-26 DIAGNOSIS — C61 Malignant neoplasm of prostate: Secondary | ICD-10-CM | POA: Diagnosis not present

## 2021-10-31 ENCOUNTER — Encounter: Payer: Self-pay | Admitting: Cardiology

## 2021-10-31 ENCOUNTER — Ambulatory Visit (INDEPENDENT_AMBULATORY_CARE_PROVIDER_SITE_OTHER): Payer: Medicare Other | Admitting: Cardiology

## 2021-10-31 VITALS — BP 126/70 | HR 55 | Ht 71.0 in | Wt 136.8 lb

## 2021-10-31 DIAGNOSIS — I25119 Atherosclerotic heart disease of native coronary artery with unspecified angina pectoris: Secondary | ICD-10-CM

## 2021-10-31 DIAGNOSIS — I10 Essential (primary) hypertension: Secondary | ICD-10-CM | POA: Diagnosis not present

## 2021-10-31 NOTE — Patient Instructions (Addendum)

## 2021-10-31 NOTE — Progress Notes (Signed)
? ? ?Cardiology Office Note ? ?Date: 10/31/2021  ? ?ID: LEMONTE AL, DOB 11-30-1930, MRN [id number] ? ?PCP:  Lawerance Cruel, MD  ?Cardiologist:  Rozann Lesches, MD ?Electrophysiologist:  None  ? ?Chief Complaint  ?Patient presents with  ? Cardiac follow-up  ? ? ?History of Present Illness: ?Randy Moyer is an 88 y.o. male last seen in October 2021.  He is here for a routine visit.  Doing very well without recurrent angina or nitroglycerin use.  He remains functional with ADLs including yard work.  He is in the process of moving toward a sale of his beach condo down in [place]. ? ?I reviewed his medications which are stable and outlined below.  He continues to follow with Dr. Harrington Challenger for primary care.  I personally reviewed his ECG which shows sinus bradycardia with nonspecific ST changes. ? ?Past Medical History:  ?Diagnosis Date  ? Borderline diabetes mellitus   ? CAD (coronary artery disease)   ? Cutting balloon angioplasty to distal LAD 2004  ? Essential hypertension   ? Hyperlipidemia   ? ? ?Past Surgical History:  ?Procedure Laterality Date  ? CORONARY ANGIOPLASTY WITH STENT PLACEMENT    ? HERNIA REPAIR    ? in the 1950's  ? TONSILLECTOMY    ? age 28 or 24  ? ? ?Current Outpatient Medications  ?Medication Sig Dispense Refill  ? aspirin 81 MG EC tablet Take 81 mg by mouth daily.      ? metoprolol succinate (TOPROL-XL) 100 MG 24 hr tablet TAKE 1 TABLET BY MOUTH ONCE A DAY 90 tablet 2  ? nitroGLYCERIN (NITROSTAT) 0.4 MG SL tablet Place 1 tablet (0.4 mg total) under the tongue every 5 (five) minutes x 3 doses as needed for chest pain (If no relief after 2nd dose, proceed to the ED for an evaluation). 25 tablet 3  ? ramipril (ALTACE) 2.5 MG capsule Take 2.5 mg by mouth daily.      ? ?No current facility-administered medications for this visit.  ? ?Allergies:  Patient has no known allergies.  ? ?ROS: Palpitations or syncope. ? ?Physical Exam: ?VS:  BP 126/70   Pulse (!) 55   Ht '5\' 11"'$  (1.803 m)   Wt 136 lb  12.8 oz (62.1 kg)   SpO2 94%   BMI 19.08 kg/m? , BMI Body mass index is 19.08 kg/m?. ? ?Wt Readings from Last 3 Encounters:  ?10/31/21 136 lb 12.8 oz (62.1 kg)  ?05/11/20 139 lb (63 kg)  ?06/01/19 147 lb 8 oz (66.9 kg)  ?  ?General: Patient appears comfortable at rest. ?HEENT: Conjunctiva and lids normal. ?Neck: Supple, no elevated JVP or carotid bruits. ?Lungs: Clear to auscultation, nonlabored breathing at rest. ?Cardiac: Regular rate and rhythm, no S3 or significant systolic murmur, no pericardial rub. ?Abdomen: Soft, nontender, bowel sounds present. ?Extremities: No pitting edema. ? ?ECG:  An ECG dated 05/11/2020 was personally reviewed today and demonstrated:  Sinus bradycardia. ? ?Recent Labwork: ? ?May 2021: Hemoglobin 13.2, platelets 272, BUN 22, creatinine 1.2, potassium 4.3, AST 13, ALT 8, cholesterol 126, triglycerides 56, HDL 51, LDL 63 ? ?Other Studies Reviewed Today: ? ?No interval cardiac testing for review today. ? ?Assessment and Plan: ? ?1.  CAD status post remote angioplasty of the distal LAD, following conservatively in the absence of any significant angina symptoms and good functional capacity.  ECG reviewed and stable.  Continue aspirin, Toprol-XL, Altace, and as needed nitroglycerin. ? ?2.  Essential hypertension, no changes made  to present regimen.  Keep follow-up with Dr. Harrington Challenger. ? ?Medication Adjustments/Labs and Tests Ordered: ?Current medicines are reviewed at length with the patient today.  Concerns regarding medicines are outlined above.  ? ?Tests Ordered: ?Orders Placed This Encounter  ?Procedures  ? EKG 12-Lead  ? ? ?Medication Changes: ?No orders of the defined types were placed in this encounter. ? ? ?Disposition:  Follow up  1 year. ? ?Signed, ?Satira Sark, MD, [facility] ?10/31/2021 1:59 PM    ?[facility] ?[address], [place], [place] [address] ?Phone: [phone]; Fax: [phone]  ?

## 2021-11-01 ENCOUNTER — Telehealth: Payer: Self-pay | Admitting: Cardiology

## 2021-11-01 MED ORDER — METOPROLOL SUCCINATE ER 100 MG PO TB24: 100.0000 mg | ORAL_TABLET | Freq: Every day | ORAL | 2 refills | Status: DC

## 2021-11-01 NOTE — Telephone Encounter (Signed)
?*  STAT* If patient is at the pharmacy, call can be transferred to refill team. ? ? ?1. Which medications need to be refilled? (please list name of each medication and dose if known)  ? metoprolol succinate (TOPROL-XL) 100 MG 24 hr tablet  ? ? ?2. Which pharmacy/location (including street and city if local pharmacy) is medication to be sent to? [facility] [place], [place]  ? ?3. Do they need a 30 day or 90 day supply? 90 day ?

## 2021-11-02 DIAGNOSIS — C61 Malignant neoplasm of prostate: Secondary | ICD-10-CM | POA: Diagnosis not present

## 2022-02-20 ENCOUNTER — Other Ambulatory Visit (HOSPITAL_COMMUNITY): Payer: Self-pay | Admitting: Family Medicine

## 2022-02-20 ENCOUNTER — Other Ambulatory Visit: Payer: Self-pay | Admitting: Family Medicine

## 2022-02-20 DIAGNOSIS — N1831 Chronic kidney disease, stage 3a: Secondary | ICD-10-CM | POA: Diagnosis not present

## 2022-02-20 DIAGNOSIS — Z0000 Encounter for general adult medical examination without abnormal findings: Secondary | ICD-10-CM | POA: Diagnosis not present

## 2022-02-20 DIAGNOSIS — R233 Spontaneous ecchymoses: Secondary | ICD-10-CM | POA: Diagnosis not present

## 2022-02-20 DIAGNOSIS — R198 Other specified symptoms and signs involving the digestive system and abdomen: Secondary | ICD-10-CM

## 2022-02-20 DIAGNOSIS — I10 Essential (primary) hypertension: Secondary | ICD-10-CM | POA: Diagnosis not present

## 2022-02-20 DIAGNOSIS — E7800 Pure hypercholesterolemia, unspecified: Secondary | ICD-10-CM | POA: Diagnosis not present

## 2022-02-20 DIAGNOSIS — J439 Emphysema, unspecified: Secondary | ICD-10-CM | POA: Diagnosis not present

## 2022-03-07 ENCOUNTER — Ambulatory Visit (HOSPITAL_COMMUNITY)
Admission: RE | Admit: 2022-03-07 | Discharge: 2022-03-07 | Disposition: A | Discharge status: Home / Self Care | Payer: Medicare Other | Source: Ambulatory Visit | Attending: Family Medicine | Admitting: Family Medicine

## 2022-03-07 DIAGNOSIS — R198 Other specified symptoms and signs involving the digestive system and abdomen: Secondary | ICD-10-CM | POA: Insufficient documentation

## 2022-03-07 DIAGNOSIS — Z136 Encounter for screening for cardiovascular disorders: Secondary | ICD-10-CM | POA: Diagnosis not present

## 2022-03-07 DIAGNOSIS — I7143 Infrarenal abdominal aortic aneurysm, without rupture: Secondary | ICD-10-CM | POA: Insufficient documentation

## 2022-05-01 DIAGNOSIS — C61 Malignant neoplasm of prostate: Secondary | ICD-10-CM | POA: Diagnosis not present

## 2022-05-08 DIAGNOSIS — C61 Malignant neoplasm of prostate: Secondary | ICD-10-CM | POA: Diagnosis not present

## 2022-05-09 DIAGNOSIS — Z23 Encounter for immunization: Secondary | ICD-10-CM | POA: Diagnosis not present

## 2022-05-09 DIAGNOSIS — Z6820 Body mass index (BMI) 20.0-20.9, adult: Secondary | ICD-10-CM | POA: Diagnosis not present

## 2022-05-09 DIAGNOSIS — I10 Essential (primary) hypertension: Secondary | ICD-10-CM | POA: Diagnosis not present

## 2022-05-22 DIAGNOSIS — H903 Sensorineural hearing loss, bilateral: Secondary | ICD-10-CM | POA: Diagnosis not present

## 2022-05-22 DIAGNOSIS — H6123 Impacted cerumen, bilateral: Secondary | ICD-10-CM | POA: Diagnosis not present

## 2022-06-11 DIAGNOSIS — H3589 Other specified retinal disorders: Secondary | ICD-10-CM | POA: Diagnosis not present

## 2022-06-11 DIAGNOSIS — Z961 Presence of intraocular lens: Secondary | ICD-10-CM | POA: Diagnosis not present

## 2022-06-11 DIAGNOSIS — H35372 Puckering of macula, left eye: Secondary | ICD-10-CM | POA: Diagnosis not present

## 2022-09-12 ENCOUNTER — Telehealth: Payer: Self-pay | Admitting: Cardiology

## 2022-09-12 NOTE — Telephone Encounter (Signed)
Pharmacy changed to [facility] on [facility] [address] [facility] per pt's request.

## 2022-09-12 NOTE — Telephone Encounter (Signed)
Pt's wife came in w/ new insurance cards and stated that the new insurance does not cover [employer] so they [facility] be [facility] now.

## 2022-12-18 ENCOUNTER — Other Ambulatory Visit: Payer: Self-pay | Admitting: Cardiology

## 2022-12-20 ENCOUNTER — Other Ambulatory Visit (HOSPITAL_COMMUNITY): Payer: Self-pay | Admitting: Urology

## 2022-12-20 DIAGNOSIS — R9721 Rising PSA following treatment for malignant neoplasm of prostate: Secondary | ICD-10-CM

## 2022-12-20 DIAGNOSIS — C61 Malignant neoplasm of prostate: Secondary | ICD-10-CM

## 2023-01-04 ENCOUNTER — Encounter (HOSPITAL_COMMUNITY)
Admission: RE | Admit: 2023-01-04 | Discharge: 2023-01-04 | Disposition: A | Discharge status: Home / Self Care | Payer: Medicare Other | Source: Ambulatory Visit | Attending: Urology | Admitting: Urology

## 2023-01-04 DIAGNOSIS — R9721 Rising PSA following treatment for malignant neoplasm of prostate: Secondary | ICD-10-CM | POA: Diagnosis present

## 2023-01-04 DIAGNOSIS — C61 Malignant neoplasm of prostate: Secondary | ICD-10-CM | POA: Insufficient documentation

## 2023-01-04 MED ORDER — PIFLIFOLASTAT F 18 (PYLARIFY) INJECTION
9.0000 | Freq: Once | INTRAVENOUS | Status: AC
  Administered 2023-01-04: 8.925 via INTRAVENOUS

## 2023-02-18 ENCOUNTER — Ambulatory Visit: Payer: Medicare Other | Attending: Cardiology | Admitting: Cardiology

## 2023-02-18 ENCOUNTER — Encounter: Payer: Self-pay | Admitting: Cardiology

## 2023-02-18 VITALS — BP 134/71 | HR 63 | Wt 135.6 lb

## 2023-02-18 DIAGNOSIS — I25119 Atherosclerotic heart disease of native coronary artery with unspecified angina pectoris: Secondary | ICD-10-CM | POA: Diagnosis not present

## 2023-02-18 DIAGNOSIS — I10 Essential (primary) hypertension: Secondary | ICD-10-CM

## 2023-02-18 MED ORDER — NITROGLYCERIN 0.4 MG SL SUBL: 0.4000 mg | SUBLINGUAL_TABLET | SUBLINGUAL | 3 refills | Status: DC | PRN

## 2023-02-18 NOTE — Progress Notes (Signed)
    Cardiology Office Note  Date: 02/18/2023   ID: RAVINDER LAMKIN, DOB 12-Mar-1931, MRN [id number]  History of Present Illness: Randy Moyer is a 89 y.o. male last seen in April 2023.  He is here for a routine visit.  Reports no chest pain or nitroglycerin use, stable NYHA class II dyspnea.  No palpitations or syncope.  I reviewed his medications, we discussed getting a refill for fresh bottle of nitroglycerin.  He continues to follow regularly with PCP, requesting interval lab work for review.  ECG today shows sinus bradycardia.  Physical Exam: VS:  BP 134/71   Pulse 63   Wt 135 lb 9.6 oz (61.5 kg)   SpO2 99%   BMI 18.91 kg/m , BMI Body mass index is 18.91 kg/m.  Wt Readings from Last 3 Encounters:  02/18/23 135 lb 9.6 oz (61.5 kg)  10/31/21 136 lb 12.8 oz (62.1 kg)  05/11/20 139 lb (63 kg)    General: Patient appears comfortable at rest. HEENT: Conjunctiva and lids normal. Neck: Supple, no elevated JVP or carotid bruits. Lungs: Clear to auscultation, nonlabored breathing at rest. Cardiac: Regular rate and rhythm, no S3 or significant systolic murmur. Extremities: No pitting edema.  ECG:  An ECG dated 10/31/2021 was personally reviewed today and demonstrated:  Sinus bradycardia with nonspecific ST changes.  Labwork:  No interval lab work for review today.  Other Studies Reviewed Today:  No interval cardiac testing for review today.  Assessment and Plan:  1.  CAD status post Cutting Balloon angioplasty of the distal LAD in 2004.  He reports no angina or major functional limitation in the last year.  Plan to continue observation on medical therapy.  ECG reviewed and stable.  Continue aspirin, Toprol-XL, and as needed nitroglycerin.  2.  Essential hypertension.  No change to current regimen.  Keep follow-up with PCP.  3.  Mixed hyperlipidemia.  Currently not on statin therapy.  Requesting interval lab work from PCP.  Disposition:  Follow up  1 year.  Signed, Jonelle Sidle, M.D., F.A.C.C. [facility] at [facility]

## 2023-02-18 NOTE — Patient Instructions (Addendum)

## 2023-05-24 ENCOUNTER — Ambulatory Visit (INDEPENDENT_AMBULATORY_CARE_PROVIDER_SITE_OTHER): Payer: Medicare Other

## 2024-01-01 ENCOUNTER — Other Ambulatory Visit (HOSPITAL_COMMUNITY): Payer: Self-pay | Admitting: Urology

## 2024-01-01 DIAGNOSIS — R9721 Rising PSA following treatment for malignant neoplasm of prostate: Secondary | ICD-10-CM

## 2024-01-01 DIAGNOSIS — C61 Malignant neoplasm of prostate: Secondary | ICD-10-CM

## 2024-01-13 ENCOUNTER — Encounter (HOSPITAL_COMMUNITY)
Admission: RE | Admit: 2024-01-13 | Discharge: 2024-01-13 | Disposition: A | Discharge status: Home / Self Care | Source: Ambulatory Visit | Attending: Urology | Admitting: Urology

## 2024-01-13 DIAGNOSIS — R9721 Rising PSA following treatment for malignant neoplasm of prostate: Secondary | ICD-10-CM | POA: Diagnosis present

## 2024-01-13 DIAGNOSIS — C61 Malignant neoplasm of prostate: Secondary | ICD-10-CM | POA: Insufficient documentation

## 2024-01-13 MED ORDER — FLOTUFOLASTAT F 18 GALLIUM 296-5846 MBQ/ML IV SOLN
8.4600 | Freq: Once | INTRAVENOUS | Status: AC
  Administered 2024-01-13: 8.46 via INTRAVENOUS

## 2024-06-02 ENCOUNTER — Ambulatory Visit: Attending: Cardiology | Admitting: Cardiology

## 2024-06-02 ENCOUNTER — Encounter: Payer: Self-pay | Admitting: Cardiology

## 2024-06-02 VITALS — BP 148/78 | HR 52 | Ht 70.0 in | Wt 130.6 lb

## 2024-06-02 DIAGNOSIS — E782 Mixed hyperlipidemia: Secondary | ICD-10-CM | POA: Diagnosis not present

## 2024-06-02 DIAGNOSIS — I25119 Atherosclerotic heart disease of native coronary artery with unspecified angina pectoris: Secondary | ICD-10-CM | POA: Diagnosis not present

## 2024-06-02 DIAGNOSIS — I10 Essential (primary) hypertension: Secondary | ICD-10-CM | POA: Diagnosis not present

## 2024-06-02 MED ORDER — NITROGLYCERIN 0.4 MG SL SUBL: 0.4000 mg | SUBLINGUAL_TABLET | SUBLINGUAL | 3 refills | Status: AC | PRN

## 2024-06-02 NOTE — Progress Notes (Signed)
    Cardiology Office Note  Date: 06/02/2024   ID: Randy Moyer, DOB 27-Feb-1931, MRN [id number]  History of Present Illness: Randy Moyer is a 89 y.o. male last seen in August 2024.  He is here today with his wife for a follow-up visit.  He continues to do remarkably well.  Reports no angina or interval nitroglycerin  use.  He continues to follow at [facility] for primary care.  We went over his medications which are stable.  He reports compliance with therapy and no obvious intolerances.  I reviewed his most recent available lab work which is noted below.  Discussed getting a refill for a fresh bottle of nitroglycerin .  I reviewed his ECG today which shows sinus bradycardia at 52 bpm.  States that he checks blood pressure and heart rate at home, heart rate typically in the 50s and he is not clearly symptomatic.  No dizziness or syncope.  Physical Exam: VS:  BP (!) 148/78   Pulse (!) 52   Ht 5' 10 (1.778 m)   Wt 130 lb 9.6 oz (59.2 kg)   SpO2 100%   BMI 18.74 kg/m , BMI Body mass index is 18.74 kg/m.  Wt Readings from Last 3 Encounters:  06/02/24 130 lb 9.6 oz (59.2 kg)  02/18/23 135 lb 9.6 oz (61.5 kg)  10/31/21 136 lb 12.8 oz (62.1 kg)    General: Patient appears comfortable at rest. HEENT: Conjunctiva and lids normal. Neck: Supple, no elevated JVP or carotid bruits. Lungs: Clear to auscultation, nonlabored breathing at rest. Cardiac: Regular rate and rhythm, no S3, 1/6 systolic murmur, no pericardial rub. Extremities: No pitting edema.  ECG:  An ECG dated 02/18/2023 was personally reviewed today and demonstrated:  Sinus bradycardia.  Labwork:  October 2025: Hgb 13.5, platelets 180, creatinine 1.35, GFR 49, potassium 4.2  Other Studies Reviewed Today:  No interval cardiac testing for review today.  Assessment and Plan:  1.  CAD status post Cutting Balloon angioplasty of the distal LAD in 2004.  Continues to do very well without angina at current level of activity.  Refill  provided for fresh bottle of nitroglycerin .  ECG reviewed today and stable.  Continue aspirin 81 mg daily and Lipitor 80 mg daily.   2.  Primary hypertension.  Continue Avapro 75 mg daily and Toprol -XL 100 mg daily.  Could always consider reducing beta-blocker dose if it becomes more clear that he has symptomatic bradycardia or further evidence of conduction system disease, neither of which are apparent today.  3.  Mixed hyperlipidemia.  Continue Lipitor 80 mg daily and keep follow-up with PCP.  Disposition:  Follow up 1 year.  Signed, Jayson JUDITHANN Sierras, M.D., F.A.C.C. [facility]

## 2024-06-02 NOTE — Patient Instructions (Addendum)

## 2024-07-27 ENCOUNTER — Ambulatory Visit: Admitting: Urology

## 2024-07-27 ENCOUNTER — Encounter: Payer: Self-pay | Admitting: Urology

## 2024-07-27 VITALS — BP 191/72 | HR 56

## 2024-07-27 DIAGNOSIS — C61 Malignant neoplasm of prostate: Secondary | ICD-10-CM

## 2024-07-27 LAB — MICROSCOPIC EXAMINATION
Bacteria, UA: NONE SEEN
WBC, UA: NONE SEEN /HPF (ref 0–5)

## 2024-07-27 LAB — URINALYSIS, ROUTINE W REFLEX MICROSCOPIC
Bilirubin, UA: NEGATIVE
Glucose, UA: NEGATIVE
Ketones, UA: NEGATIVE
Leukocytes,UA: NEGATIVE
Nitrite, UA: NEGATIVE
Specific Gravity, UA: 1.025 (ref 1.005–1.030)
Urobilinogen, Ur: 0.2 mg/dL (ref 0.2–1.0)
pH, UA: 5.5 (ref 5.0–7.5)

## 2024-07-27 NOTE — Patient Instructions (Signed)

## 2024-07-27 NOTE — Progress Notes (Unsigned)
 "  07/27/2024 9:13 AM   Randy Moyer Jun 29, 1931 [id number]  Referring provider: Okey Carlin Redbird, MD [address] [place],  [place] [address]  Prostate cancer   HPI: Randy Moyer is a 89yo here for evaluation of prostate cancer. He previously followed with Dr. Alline and Carolee at [facility]. He was originally diagnosed with intermediate risk prostate cancer in 2011-2013. His PSA was originally 9.5 but has risen to 56.6 in the past 10 years. His last PSMA PET scan was 12/2023 which showed no evidence of metastatic disease. IPSS 10 QOL 1 on no BPH therapy. No bone pain   PMH: Past Medical History:  Diagnosis Date   Borderline diabetes mellitus    CAD (coronary artery disease)    Cutting balloon angioplasty to distal LAD 2004   Essential hypertension    Hyperlipidemia     Surgical History: Past Surgical History:  Procedure Laterality Date   CORONARY ANGIOPLASTY WITH STENT PLACEMENT     HERNIA REPAIR     in the 55's   TONSILLECTOMY     age 44 or 88    Home Medications:  Allergies as of 07/27/2024   No Known Allergies      Medication List        Accurate as of July 27, 2024  9:13 AM. If you have any questions, ask your nurse or doctor.          aspirin EC 81 MG tablet Take 81 mg by mouth daily.   atorvastatin 80 MG tablet Commonly known as: LIPITOR Take 80 mg by mouth daily.   irbesartan 75 MG tablet Commonly known as: AVAPRO Take 75 mg by mouth daily.   metoprolol  succinate 100 MG 24 hr tablet Commonly known as: TOPROL -XL TAKE 1 TABLET BY MOUTH EVERY DAY FOLLOWING A MEAL   nitroGLYCERIN  0.4 MG SL tablet Commonly known as: NITROSTAT  Place 1 tablet (0.4 mg total) under the tongue every 5 (five) minutes x 3 doses as needed for chest pain (If no relief after 2nd dose, proceed to the ED for an evaluation).        Allergies: Allergies[1]  Family History: Family History  Problem Relation Age of Onset   Coronary artery disease Other      Social History:  reports that he quit smoking about 66 years ago. His smoking use included cigarettes. He started smoking about 68 years ago. He has a 3 pack-year smoking history. His smokeless tobacco use includes chew. No history on file for alcohol use and drug use.  ROS: All other review of systems were reviewed and are negative except what is noted above in HPI  Physical Exam: BP (!) 191/72   Pulse (!) 56   Constitutional:  Alert and oriented, No acute distress. HEENT: [place] AT, moist mucus membranes.  Trachea midline, no masses. Cardiovascular: No clubbing, cyanosis, or edema. Respiratory: Normal respiratory effort, no increased work of breathing. GI: Abdomen is soft, nontender, nondistended, no abdominal masses GU: No CVA tenderness.  Lymph: No cervical or inguinal lymphadenopathy. Skin: No rashes, bruises or suspicious lesions. Neurologic: Grossly intact, no focal deficits, moving all 4 extremities. Psychiatric: Normal mood and affect.  Laboratory Data: No results found for: WBC, HGB, HCT, MCV, PLT  No results found for: CREATININE  No results found for: PSA  No results found for: TESTOSTERONE  No results found for: HGBA1C  Urinalysis No results found for: COLORURINE, APPEARANCEUR, LABSPEC, PHURINE, GLUCOSEU, HGBUR, BILIRUBINUR, KETONESUR, PROTEINUR, UROBILINOGEN, NITRITE, LEUKOCYTESUR  No results  found for: LABMICR, WBCUA, RBCUA, LABEPIT, MUCUS, BACTERIA  Pertinent Imaging:  No results found for this or any previous visit.  No results found for this or any previous visit.  No results found for this or any previous visit.  No results found for this or any previous visit.  No results found for this or any previous visit.  No results found for this or any previous visit.  No results found for this or any previous visit.  No results found for this or any previous visit.   Assessment & Plan:    1.  Prostate cancer (HCC) (Primary) The patient desires to continue surveillance. PSA today, will call with results. Followup 6 months with PSA - Urinalysis, Routine w reflex microscopic   No follow-ups on file.  Randy Clara, MD  [facility] [place]       [1] No Known Allergies  "

## 2024-07-28 LAB — PSA: Prostate Specific Ag, Serum: 86.2 ng/mL — ABNORMAL HIGH (ref 0.0–4.0)

## 2024-08-06 ENCOUNTER — Ambulatory Visit: Payer: Self-pay

## 2024-08-06 NOTE — Telephone Encounter (Signed)
 "  Letter sent.   "

## 2025-01-28 ENCOUNTER — Other Ambulatory Visit

## 2025-02-03 ENCOUNTER — Ambulatory Visit: Admitting: Urology
